# Patient Record
Sex: Female | Born: 1935 | Race: White | Hispanic: No | Smoking: Former smoker
Health system: Southern US, Community
[De-identification: ages and names within clinical notes are randomized; demographics above are authoritative.]

## PROBLEM LIST (undated history)

## (undated) DIAGNOSIS — I10 Essential (primary) hypertension: Secondary | ICD-10-CM

## (undated) DIAGNOSIS — R600 Localized edema: Secondary | ICD-10-CM

## (undated) DIAGNOSIS — I4891 Unspecified atrial fibrillation: Secondary | ICD-10-CM

## (undated) HISTORY — PX: UTERINE FIBROID SURGERY: SHX826

## (undated) HISTORY — DX: Localized edema: R60.0

## (undated) HISTORY — DX: Essential (primary) hypertension: I10

## (undated) HISTORY — DX: Unspecified atrial fibrillation: I48.91

---

## 2014-11-23 ENCOUNTER — Ambulatory Visit (INDEPENDENT_AMBULATORY_CARE_PROVIDER_SITE_OTHER): Payer: Medicare Other | Admitting: Family Medicine

## 2014-11-23 ENCOUNTER — Encounter: Payer: Self-pay | Admitting: Family Medicine

## 2014-11-23 VITALS — BP 100/70 | HR 64 | Ht <= 58 in | Wt 142.0 lb

## 2014-11-23 DIAGNOSIS — I482 Chronic atrial fibrillation, unspecified: Secondary | ICD-10-CM

## 2014-11-23 DIAGNOSIS — R2681 Unsteadiness on feet: Secondary | ICD-10-CM | POA: Diagnosis not present

## 2014-11-23 DIAGNOSIS — H409 Unspecified glaucoma: Secondary | ICD-10-CM

## 2014-11-23 DIAGNOSIS — S8001XA Contusion of right knee, initial encounter: Secondary | ICD-10-CM

## 2014-11-23 DIAGNOSIS — R600 Localized edema: Secondary | ICD-10-CM | POA: Diagnosis not present

## 2014-11-23 DIAGNOSIS — E559 Vitamin D deficiency, unspecified: Secondary | ICD-10-CM

## 2014-11-23 DIAGNOSIS — R413 Other amnesia: Secondary | ICD-10-CM

## 2014-11-23 DIAGNOSIS — I952 Hypotension due to drugs: Secondary | ICD-10-CM | POA: Diagnosis not present

## 2014-11-23 DIAGNOSIS — H3530 Unspecified macular degeneration: Secondary | ICD-10-CM | POA: Diagnosis not present

## 2014-11-23 NOTE — Patient Instructions (Signed)
Decrease verapamil SR to 120 mg daily (1/2 current tablet). Decrease Lasix (furosemide) to 20 mg daily (1/2 current tablet). Stop Coumadin (warfarin) and begin aspirin 81 mg daily (baby aspirin).

## 2014-11-24 DIAGNOSIS — H3530 Unspecified macular degeneration: Secondary | ICD-10-CM | POA: Insufficient documentation

## 2014-11-24 DIAGNOSIS — H409 Unspecified glaucoma: Secondary | ICD-10-CM | POA: Insufficient documentation

## 2014-11-24 DIAGNOSIS — R2681 Unsteadiness on feet: Secondary | ICD-10-CM | POA: Insufficient documentation

## 2014-11-24 DIAGNOSIS — R600 Localized edema: Secondary | ICD-10-CM | POA: Insufficient documentation

## 2014-11-24 DIAGNOSIS — I482 Chronic atrial fibrillation, unspecified: Secondary | ICD-10-CM | POA: Insufficient documentation

## 2014-11-24 DIAGNOSIS — E559 Vitamin D deficiency, unspecified: Secondary | ICD-10-CM | POA: Insufficient documentation

## 2014-11-24 DIAGNOSIS — R413 Other amnesia: Secondary | ICD-10-CM | POA: Insufficient documentation

## 2014-11-24 LAB — CBC
HEMOGLOBIN: 14.3 g/dL (ref 11.1–15.9)
Hematocrit: 42.4 % (ref 34.0–46.6)
MCH: 31.2 pg (ref 26.6–33.0)
MCHC: 33.7 g/dL (ref 31.5–35.7)
MCV: 92 fL (ref 79–97)
PLATELETS: 228 10*3/uL (ref 150–379)
RBC: 4.59 x10E6/uL (ref 3.77–5.28)
RDW: 14 % (ref 12.3–15.4)
WBC: 6.8 10*3/uL (ref 3.4–10.8)

## 2014-11-24 LAB — COMPREHENSIVE METABOLIC PANEL
ALK PHOS: 63 IU/L (ref 39–117)
ALT: 8 IU/L (ref 0–32)
AST: 15 IU/L (ref 0–40)
Albumin/Globulin Ratio: 1.4 (ref 1.1–2.5)
Albumin: 3.5 g/dL (ref 3.2–4.6)
BUN/Creatinine Ratio: 18 (ref 11–26)
BUN: 13 mg/dL (ref 10–36)
Bilirubin Total: 0.9 mg/dL (ref 0.0–1.2)
CALCIUM: 9.3 mg/dL (ref 8.7–10.3)
CO2: 23 mmol/L (ref 18–29)
Chloride: 102 mmol/L (ref 97–108)
Creatinine, Ser: 0.73 mg/dL (ref 0.57–1.00)
GFR, EST AFRICAN AMERICAN: 79 mL/min/{1.73_m2} (ref >59)
GFR, EST NON AFRICAN AMERICAN: 68 mL/min/{1.73_m2} (ref >59)
GLOBULIN, TOTAL: 2.5 g/dL (ref 1.5–4.5)
GLUCOSE: 88 mg/dL (ref 65–99)
POTASSIUM: 4.2 mmol/L (ref 3.5–5.2)
Sodium: 140 mmol/L (ref 134–144)
Total Protein: 6 g/dL (ref 6.0–8.5)

## 2014-11-24 LAB — TSH: TSH: 3.5 u[IU]/mL (ref 0.450–4.500)

## 2014-11-24 LAB — VITAMIN D 25 HYDROXY (VIT D DEFICIENCY, FRACTURES): Vit D, 25-Hydroxy: 29.9 ng/mL — ABNORMAL LOW (ref 30.0–100.0)

## 2014-11-24 MED ORDER — ASPIRIN 81 MG PO TABS: 81.0000 mg | ORAL_TABLET | Freq: Every day | ORAL | Status: AC

## 2014-11-24 NOTE — Addendum Note (Signed)
Addended by: Schuyler Amor on: 11/24/2014 05:11 PM   Modules accepted: Orders, Medications

## 2014-11-24 NOTE — Progress Notes (Signed)
Date:  11/23/2014   Name:  Maureen Arnold   DOB:  March 06, 1913   MRN:  [id number]  PCP:  Schuyler Amor, MD    Chief Complaint: Establish Care   History of Present Illness:  This is a 81 y.o. female with MMP including chronic afib, BLE edema, recent fall, and ST memory loss. Has been on verapamil and warfarin for 2 years, having increased bruising and dislikes blood draws. Fell two days ago injuring R knee, able to bear weight but hurts. Has BLE edema with stasis ulcer now healed, wears compression stockings, on Lasix prn, none in 5 days as traveling, usually helps, no known CHF. ST memory loss past 1.5 yrs, LT ok per daughter. Was getting home health PT, last saw MD in rehab 6 wks ago, hospitalized for pneumonia over winter with slow recovery. Weight down 8# past 2 months. Hospitalized for constipation in past. Needs optho and podiatry referrals glaucoma/MD and toenail care. Hearing poor. Incontinent urine, wears Depends.  Review of Systems:  Review of Systems  Constitutional: Negative for fever.  HENT: Negative for ear pain, sore throat and trouble swallowing.   Eyes: Negative for pain.  Respiratory: Negative for shortness of breath and wheezing.   Cardiovascular: Negative for chest pain.  Gastrointestinal: Negative for abdominal pain and constipation.  Endocrine: Negative for polyuria.  Genitourinary: Negative for dysuria.  Musculoskeletal: Negative for joint swelling.  Skin: Negative for rash.  Neurological: Negative for tremors and syncope.  Hematological: Negative for adenopathy.  Psychiatric/Behavioral: Negative for behavioral problems and sleep disturbance.    There are no active problems to display for this patient.   Prior to Admission medications   Medication Sig Start Date End Date Taking? Authorizing Provider  furosemide (LASIX) 40 MG tablet Take 40 mg by mouth.   Yes Historical Provider, MD  latanoprost (XALATAN) 0.005 % ophthalmic solution Place 1 drop into the left eye  at bedtime.   Yes Historical Provider, MD  verapamil (VERELAN PM) 240 MG 24 hr capsule Take 240 mg by mouth at bedtime.   Yes Historical Provider, MD  warfarin (COUMADIN) 2 MG tablet Take 2 mg by mouth daily.   Yes Historical Provider, MD    Allergies  Allergen Reactions  . Ibuprofen     bleeding    History reviewed. No pertinent past surgical history.  History  Substance Use Topics  . Smoking status: Former Smoker    Quit date: 11/20/1958  . Smokeless tobacco: Not on file  . Alcohol Use: No    History reviewed. No pertinent family history.  Medication list has been reviewed and updated.  Physical Examination: BP 100/70 mmHg  Pulse 64  Ht  (1.473 m)  Wt 142 lb (64.411 kg)  BMI 29.69 kg/m2  Physical Exam  Constitutional: She appears well-developed. No distress.  HENT:  Head: Normocephalic and atraumatic.  Nose: Nose normal.  Mouth/Throat: Oropharynx is clear and moist.  B TM's obscured by cerumen  Eyes: Conjunctivae and EOM are normal. Pupils are equal, round, and reactive to light.  Neck: Neck supple. No thyromegaly present.  Cardiovascular: Normal rate and normal heart sounds.   Irregular rhythm  Pulmonary/Chest: Effort normal and breath sounds normal.  Abdominal: Soft. She exhibits no distension and no mass. There is no tenderness.  Musculoskeletal:  3+ B LE edema to knees, comp stockings in place R knee stable, bears weight well, sl ecchymosis  Lymphadenopathy:    She has no cervical adenopathy.  Neurological: She is alert. Coordination normal.  Skin: Skin is warm and dry. No rash noted.  Psychiatric: She has a normal mood and affect. Her behavior is normal.  Nursing note and vitals reviewed.   Assessment and Plan:  1. Chronic atrial fibrillation D/c warfarin given increased bruising and falls, begin asa 81mg  daily - TSH - Comprehensive Metabolic Panel (CMET) - CBC  2. Gait instability Check vit D and B12, refer for home health PT  3.  Short-term memory loss Consider MMSE next visit  4. Hypotension due to drugs Decrease verapamil SR to 120 mg daily  5. Bilateral lower extremity edema Restart Lasix 20 mg daily  6. Glaucoma Refer optho  7. Macular degeneration Refer optho  8. Knee contusion, right, initial encounter Monitor  9. Toenail care Refer podiatry per daughter request  Return in about 4 weeks (around 12/21/2014).  Dionne Ano. Kingsley Spittle MD [facility]  11/24/2014

## 2014-11-27 LAB — B-12 BINDING CAPACITY: VITAMIN B12 BIND CAP: 626 pg/mL — AB (ref 725–2045)

## 2014-12-06 ENCOUNTER — Ambulatory Visit: Payer: Self-pay | Admitting: Family Medicine

## 2014-12-27 ENCOUNTER — Encounter: Payer: Self-pay | Admitting: Family Medicine

## 2014-12-27 ENCOUNTER — Other Ambulatory Visit: Payer: Self-pay | Admitting: Family Medicine

## 2014-12-27 ENCOUNTER — Ambulatory Visit (INDEPENDENT_AMBULATORY_CARE_PROVIDER_SITE_OTHER): Payer: Medicare Other | Admitting: Family Medicine

## 2014-12-27 VITALS — BP 122/76 | HR 84 | Ht <= 58 in | Wt 132.0 lb

## 2014-12-27 DIAGNOSIS — H409 Unspecified glaucoma: Secondary | ICD-10-CM

## 2014-12-27 DIAGNOSIS — R600 Localized edema: Secondary | ICD-10-CM

## 2014-12-27 DIAGNOSIS — N3001 Acute cystitis with hematuria: Secondary | ICD-10-CM

## 2014-12-27 DIAGNOSIS — R413 Other amnesia: Secondary | ICD-10-CM | POA: Diagnosis not present

## 2014-12-27 DIAGNOSIS — R410 Disorientation, unspecified: Secondary | ICD-10-CM | POA: Diagnosis not present

## 2014-12-27 DIAGNOSIS — I482 Chronic atrial fibrillation, unspecified: Secondary | ICD-10-CM

## 2014-12-27 DIAGNOSIS — R2681 Unsteadiness on feet: Secondary | ICD-10-CM

## 2014-12-27 LAB — POCT URINALYSIS DIPSTICK
Bilirubin, UA: NEGATIVE
Glucose, UA: NEGATIVE
Ketones, UA: NEGATIVE
Nitrite, UA: POSITIVE
Spec Grav, UA: 1.025
Urobilinogen, UA: 0.2
pH, UA: 6

## 2014-12-27 MED ORDER — VERAPAMIL HCL ER 240 MG PO TBCR: 120.0000 mg | EXTENDED_RELEASE_TABLET | Freq: Every day | ORAL | Status: DC

## 2014-12-27 MED ORDER — SULFAMETHOXAZOLE-TRIMETHOPRIM 800-160 MG PO TABS: 1.0000 | ORAL_TABLET | Freq: Two times a day (BID) | ORAL | Status: DC

## 2014-12-27 NOTE — Progress Notes (Signed)
Date:  12/27/2014   Name:  Maureen Arnold   DOB:  1912-07-01   MRN:  [id number]  PCP:  Schuyler Amor, MD    Chief Complaint: Follow-up and Altered Mental Status   History of Present Illness:  This is a 81 y.o. female with increased confusion past week. Home PT seeing for gait instability, BLE edema improved on Lasix initially but pt now refusing to take as makes urinate more, last dose 8 days ago. Saw optho for glaucoma/MD, no changes made. Also saw podiatry for toenail trimming. Appetite also decreased past week.  Review of Systems:  Review of Systems  Constitutional: Negative for fever.  Genitourinary: Negative for dysuria, hematuria, flank pain and difficulty urinating.    Patient Active Problem List   Diagnosis Date Noted  . Chronic atrial fibrillation 11/24/2014  . Gait instability 11/24/2014  . Short-term memory loss 11/24/2014  . Bilateral lower extremity edema 11/24/2014  . Glaucoma 11/24/2014  . Macular degeneration 11/24/2014  . Vitamin D deficiency 11/24/2014    Prior to Admission medications   Medication Sig Start Date End Date Taking? Authorizing Provider  aspirin 81 MG tablet Take 1 tablet (81 mg total) by mouth daily. 11/24/14  Yes Schuyler Amor, MD  latanoprost (XALATAN) 0.005 % ophthalmic solution Place 1 drop into the left eye at bedtime.   Yes Historical Provider, MD  furosemide (LASIX) 40 MG tablet Take 20 mg by mouth every other day.    Historical Provider, MD  sulfamethoxazole-trimethoprim (BACTRIM DS,SEPTRA DS) 800-160 MG per tablet Take 1 tablet by mouth 2 (two) times daily. 12/27/14   Schuyler Amor, MD  verapamil (CALAN-SR) 240 MG CR tablet Take 0.5 tablets (120 mg total) by mouth daily. 12/27/14   Schuyler Amor, MD    Allergies  Allergen Reactions  . Ibuprofen     bleeding    History reviewed. No pertinent past surgical history.  Social History  Substance Use Topics  . Smoking status: Former Smoker    Quit date: 11/20/1958  . Smokeless tobacco:  None  . Alcohol Use: No    History reviewed. No pertinent family history.  Medication list has been reviewed and updated.  Physical Examination: BP 122/76 mmHg  Pulse 84  Ht  (1.473 m)  Wt 132 lb (59.875 kg)  BMI 27.60 kg/m2  Physical Exam  Constitutional: She appears well-developed and well-nourished.  Cardiovascular: Normal rate and normal heart sounds.   Irregularly irregular rhythm  Pulmonary/Chest: Effort normal and breath sounds normal.  Musculoskeletal:  1+ BLE edema  Neurological: She is alert.  Disoriented to place and time Able to count backwards from 20 2/5 recall  Skin: Skin is warm and dry. No rash noted.  Psychiatric: She has a normal mood and affect.    Assessment and Plan:  1. Acute cystitis with hematuria UA shows pos nitrite, 2+ leuk, tr blood - Urine Culture - sulfamethoxazole-trimethoprim (BACTRIM DS,SEPTRA DS) 800-160 MG per tablet; Take 1 tablet by mouth 2 (two) times daily.  Dispense: 6 tablet; Refill: 0  2. Confusion Likely due to UTI - B12 - POCT urinalysis dipstick  3. Chronic atrial fibrillation Well controlled on decreased verapamil dose, off warfarin, cont asa  4. Gait instability Improved with home PT, consider vitamin D supplementation next visit given marginal level  5. Short-term memory loss - B12  6. Bilateral lower extremity edema Improved, try Lasix 20 mg qod to maintain  7. Glaucoma Saw optho, continue eye drops   Return in about 4 weeks (around  01/24/2015).  Dionne Ano. Kingsley Spittle MD [facility]  12/27/2014

## 2014-12-28 LAB — VITAMIN B12: Vitamin B-12: 396 pg/mL (ref 211–946)

## 2014-12-29 LAB — URINE CULTURE

## 2015-01-26 ENCOUNTER — Ambulatory Visit: Payer: Medicare Other | Admitting: Family Medicine

## 2015-01-31 ENCOUNTER — Encounter: Payer: Self-pay | Admitting: Family Medicine

## 2015-01-31 ENCOUNTER — Ambulatory Visit (INDEPENDENT_AMBULATORY_CARE_PROVIDER_SITE_OTHER): Payer: Medicare Other | Admitting: Family Medicine

## 2015-01-31 VITALS — BP 110/72 | HR 62 | Ht <= 58 in | Wt 124.0 lb

## 2015-01-31 DIAGNOSIS — Z23 Encounter for immunization: Secondary | ICD-10-CM

## 2015-01-31 DIAGNOSIS — I482 Chronic atrial fibrillation, unspecified: Secondary | ICD-10-CM

## 2015-01-31 DIAGNOSIS — R2681 Unsteadiness on feet: Secondary | ICD-10-CM

## 2015-01-31 DIAGNOSIS — E559 Vitamin D deficiency, unspecified: Secondary | ICD-10-CM

## 2015-01-31 DIAGNOSIS — R413 Other amnesia: Secondary | ICD-10-CM | POA: Diagnosis not present

## 2015-01-31 DIAGNOSIS — R600 Localized edema: Secondary | ICD-10-CM

## 2015-01-31 MED ORDER — VITAMIN D3 25 MCG (1000 UT) PO CAPS: 1.0000 | ORAL_CAPSULE | Freq: Every day | ORAL | Status: AC

## 2015-02-01 ENCOUNTER — Telehealth: Payer: Self-pay

## 2015-02-01 NOTE — Telephone Encounter (Signed)
Sent to [place] 

## 2015-02-01 NOTE — Telephone Encounter (Signed)
Done

## 2015-02-01 NOTE — Progress Notes (Signed)
Date:  01/31/2015   Name:  Maureen Arnold   DOB:  September 23, 1912   MRN:  [id number]  PCP:  Schuyler AmorWilliam Taffany Heiser, MD    Chief Complaint: vomitting and Follow-up   History of Present Illness:  This is a 81 y.o. female for f/u MMP. UTI sxs from last visit resolved. Stopped Lasix last week and BLE edema has stayed stable with compression stockings. Sundowning. Vomited once yesterday but none today. Bathing is issue, daughter says can't do alone, want HH referral. Still very unsteady on feet but no falls, using rolling walker. Vit D borderline low in August. Needs flu imm.  Review of Systems:  Review of Systems  Constitutional: Negative for fever and unexpected weight change.  HENT: Negative for trouble swallowing.   Respiratory: Negative for shortness of breath.   Cardiovascular: Negative for chest pain.  Gastrointestinal: Negative for abdominal pain.  Genitourinary: Negative for difficulty urinating.  Skin: Negative for rash.  Neurological: Negative for dizziness, tremors, syncope and light-headedness.  Psychiatric/Behavioral: Negative for hallucinations.    Patient Active Problem List   Diagnosis Date Noted  . Chronic atrial fibrillation (HCC) 11/24/2014  . Gait instability 11/24/2014  . Short-term memory loss 11/24/2014  . Bilateral lower extremity edema 11/24/2014  . Glaucoma 11/24/2014  . Macular degeneration 11/24/2014  . Vitamin D deficiency 11/24/2014    Prior to Admission medications   Medication Sig Start Date End Date Taking? Authorizing Provider  aspirin 81 MG tablet Take 1 tablet (81 mg total) by mouth daily. 11/24/14  Yes Schuyler AmorWilliam Desiree Fleming, MD  latanoprost (XALATAN) 0.005 % ophthalmic solution Place 1 drop into the left eye at bedtime.   Yes Historical Provider, MD  verapamil (CALAN-SR) 240 MG CR tablet Take 0.5 tablets (120 mg total) by mouth daily. 12/27/14  Yes Schuyler AmorWilliam Destiny Hagin, MD  Cholecalciferol (VITAMIN D3) 1000 UNITS CAPS Take 1 capsule (1,000 Units total) by mouth daily.  01/31/15   Schuyler AmorWilliam Gianfranco Araki, MD  furosemide (LASIX) 40 MG tablet Take 20 mg by mouth every other day.    Historical Provider, MD    Allergies  Allergen Reactions  . Ibuprofen     bleeding    No past surgical history on file.  Social History  Substance Use Topics  . Smoking status: Former Smoker    Quit date: 11/20/1958  . Smokeless tobacco: None  . Alcohol Use: No    No family history on file.  Medication list has been reviewed and updated.  Physical Examination: BP 110/72 mmHg  Pulse 62  Ht 4\' 10"  (1.473 m)  Wt 124 lb (56.246 kg)  BMI 25.92 kg/m2  Physical Exam  Constitutional: She appears well-developed and well-nourished. No distress.  Cardiovascular: Normal rate and normal heart sounds.   Irregular rhythm  Pulmonary/Chest: Effort normal and breath sounds normal.  Musculoskeletal:  1+ BLE edema  Neurological: She is alert. Coordination normal.  Skin: Skin is warm and dry.  Psychiatric: She has a normal mood and affect. Her behavior is normal.  Nursing note and vitals reviewed.   Assessment and Plan:  1. Chronic atrial fibrillation (HCC) Continue asa (off warfarin due to fall risk)  2. Gait instability Begin vit D supplement, HH referral  3. Short-term memory loss Chronic with sundowning  4. Bilateral lower extremity edema Stable off Lasix for now, continue compression stockings  5. Vitamin D deficiency Begin vit D supplement  6. Need for influenza vaccination - Flu Vaccine QUAD 36+ mos PF IM (Fluarix & Fluzone Quad PF)   No Follow-up  on file.  Dionne Ano. Kingsley Spittle MD [facility]  02/01/2015

## 2015-02-19 ENCOUNTER — Other Ambulatory Visit: Payer: Self-pay

## 2015-03-08 ENCOUNTER — Emergency Department: Payer: Medicare Other

## 2015-03-08 ENCOUNTER — Inpatient Hospital Stay
Admission: EM | Admit: 2015-03-08 | Discharge: 2015-03-12 | DRG: 309 | Disposition: A | Discharge status: Skilled Nursing Facility | Payer: Medicare Other | Attending: Internal Medicine | Admitting: Internal Medicine

## 2015-03-08 ENCOUNTER — Encounter: Payer: Self-pay | Admitting: Emergency Medicine

## 2015-03-08 DIAGNOSIS — B9620 Unspecified Escherichia coli [E. coli] as the cause of diseases classified elsewhere: Secondary | ICD-10-CM | POA: Diagnosis present

## 2015-03-08 DIAGNOSIS — I482 Chronic atrial fibrillation: Principal | ICD-10-CM | POA: Diagnosis present

## 2015-03-08 DIAGNOSIS — Z888 Allergy status to other drugs, medicaments and biological substances status: Secondary | ICD-10-CM | POA: Diagnosis not present

## 2015-03-08 DIAGNOSIS — R109 Unspecified abdominal pain: Secondary | ICD-10-CM

## 2015-03-08 DIAGNOSIS — G8929 Other chronic pain: Secondary | ICD-10-CM | POA: Diagnosis present

## 2015-03-08 DIAGNOSIS — N390 Urinary tract infection, site not specified: Secondary | ICD-10-CM | POA: Diagnosis present

## 2015-03-08 DIAGNOSIS — N3090 Cystitis, unspecified without hematuria: Secondary | ICD-10-CM

## 2015-03-08 DIAGNOSIS — Z79899 Other long term (current) drug therapy: Secondary | ICD-10-CM

## 2015-03-08 DIAGNOSIS — I10 Essential (primary) hypertension: Secondary | ICD-10-CM | POA: Diagnosis present

## 2015-03-08 DIAGNOSIS — I4891 Unspecified atrial fibrillation: Secondary | ICD-10-CM | POA: Diagnosis present

## 2015-03-08 DIAGNOSIS — E876 Hypokalemia: Secondary | ICD-10-CM | POA: Diagnosis present

## 2015-03-08 DIAGNOSIS — D693 Immune thrombocytopenic purpura: Secondary | ICD-10-CM | POA: Diagnosis present

## 2015-03-08 DIAGNOSIS — Z7982 Long term (current) use of aspirin: Secondary | ICD-10-CM | POA: Diagnosis not present

## 2015-03-08 DIAGNOSIS — Z87891 Personal history of nicotine dependence: Secondary | ICD-10-CM | POA: Diagnosis not present

## 2015-03-08 DIAGNOSIS — R1031 Right lower quadrant pain: Secondary | ICD-10-CM

## 2015-03-08 DIAGNOSIS — Z833 Family history of diabetes mellitus: Secondary | ICD-10-CM | POA: Diagnosis not present

## 2015-03-08 DIAGNOSIS — K567 Ileus, unspecified: Secondary | ICD-10-CM | POA: Diagnosis present

## 2015-03-08 LAB — CBC WITH DIFFERENTIAL/PLATELET
BASOS ABS: 0 10*3/uL (ref 0–0.1)
Basophils Relative: 1 %
EOS PCT: 1 %
Eosinophils Absolute: 0.1 10*3/uL (ref 0–0.7)
HCT: 45.3 % (ref 35.0–47.0)
Hemoglobin: 15.2 g/dL (ref 12.0–16.0)
LYMPHS ABS: 1.2 10*3/uL (ref 1.0–3.6)
LYMPHS PCT: 16 %
MCH: 31.4 pg (ref 26.0–34.0)
MCHC: 33.5 g/dL (ref 32.0–36.0)
MCV: 93.8 fL (ref 80.0–100.0)
MONO ABS: 0.7 10*3/uL (ref 0.2–0.9)
Monocytes Relative: 9 %
Neutro Abs: 5.5 10*3/uL (ref 1.4–6.5)
Neutrophils Relative %: 73 %
PLATELETS: 148 10*3/uL — AB (ref 150–440)
RBC: 4.84 MIL/uL (ref 3.80–5.20)
RDW: 13.2 % (ref 11.5–14.5)
WBC: 7.5 10*3/uL (ref 3.6–11.0)

## 2015-03-08 LAB — URINALYSIS COMPLETE WITH MICROSCOPIC (ARMC ONLY)
Bilirubin Urine: NEGATIVE
GLUCOSE, UA: NEGATIVE mg/dL
Hgb urine dipstick: NEGATIVE
NITRITE: POSITIVE — AB
Protein, ur: 30 mg/dL — AB
SPECIFIC GRAVITY, URINE: 1.018 (ref 1.005–1.030)
pH: 5 (ref 5.0–8.0)

## 2015-03-08 LAB — COMPREHENSIVE METABOLIC PANEL
ALT: 11 U/L — ABNORMAL LOW (ref 14–54)
ANION GAP: 8 (ref 5–15)
AST: 16 U/L (ref 15–41)
Albumin: 3.1 g/dL — ABNORMAL LOW (ref 3.5–5.0)
Alkaline Phosphatase: 54 U/L (ref 38–126)
BUN: 16 mg/dL (ref 6–20)
CHLORIDE: 106 mmol/L (ref 101–111)
CO2: 25 mmol/L (ref 22–32)
Calcium: 9.5 mg/dL (ref 8.9–10.3)
Creatinine, Ser: 0.55 mg/dL (ref 0.44–1.00)
Glucose, Bld: 91 mg/dL (ref 65–99)
POTASSIUM: 3.4 mmol/L — AB (ref 3.5–5.1)
Sodium: 139 mmol/L (ref 135–145)
Total Bilirubin: 1.7 mg/dL — ABNORMAL HIGH (ref 0.3–1.2)
Total Protein: 6.1 g/dL — ABNORMAL LOW (ref 6.5–8.1)

## 2015-03-08 LAB — LIPASE, BLOOD: LIPASE: 25 U/L (ref 11–51)

## 2015-03-08 LAB — TROPONIN I

## 2015-03-08 MED ORDER — ONDANSETRON HCL 4 MG/2ML IJ SOLN: 4.0000 mg | Freq: Four times a day (QID) | INTRAMUSCULAR | Status: DC | PRN

## 2015-03-08 MED ORDER — ACETAMINOPHEN 650 MG RE SUPP: 650.0000 mg | Freq: Four times a day (QID) | RECTAL | Status: DC | PRN

## 2015-03-08 MED ORDER — LORAZEPAM 2 MG/ML IJ SOLN
0.5000 mg | Freq: Once | INTRAMUSCULAR | Status: AC
  Administered 2015-03-08: 0.5 mg via INTRAVENOUS
  Filled 2015-03-08: qty 1

## 2015-03-08 MED ORDER — DILTIAZEM HCL 30 MG PO TABS
30.0000 mg | ORAL_TABLET | Freq: Once | ORAL | Status: AC
  Administered 2015-03-08: 30 mg via ORAL
  Filled 2015-03-08: qty 1

## 2015-03-08 MED ORDER — LORAZEPAM 2 MG/ML IJ SOLN: 0.5000 mg | Freq: Once | INTRAMUSCULAR | Status: DC

## 2015-03-08 MED ORDER — LIDOCAINE-EPINEPHRINE (PF) 1 %-1:200000 IJ SOLN
INTRAMUSCULAR | Status: AC
  Filled 2015-03-08: qty 30

## 2015-03-08 MED ORDER — MORPHINE SULFATE (PF) 2 MG/ML IV SOLN
2.0000 mg | Freq: Once | INTRAVENOUS | Status: DC
  Filled 2015-03-08: qty 1

## 2015-03-08 MED ORDER — IOHEXOL 240 MG/ML SOLN
25.0000 mL | Freq: Once | INTRAMUSCULAR | Status: AC | PRN
  Administered 2015-03-08: 25 mL via ORAL

## 2015-03-08 MED ORDER — SODIUM CHLORIDE 0.9 % IV SOLN
INTRAVENOUS | Status: DC
  Administered 2015-03-09: 02:00:00 via INTRAVENOUS

## 2015-03-08 MED ORDER — ONDANSETRON HCL 4 MG PO TABS
4.0000 mg | ORAL_TABLET | Freq: Four times a day (QID) | ORAL | Status: DC | PRN
  Administered 2015-03-11: 4 mg via ORAL
  Filled 2015-03-08: qty 1

## 2015-03-08 MED ORDER — SODIUM CHLORIDE 0.9 % IJ SOLN
3.0000 mL | Freq: Two times a day (BID) | INTRAMUSCULAR | Status: DC
  Administered 2015-03-09 – 2015-03-12 (×7): 3 mL via INTRAVENOUS

## 2015-03-08 MED ORDER — SODIUM CHLORIDE 0.9 % IV SOLN
1000.0000 mL | Freq: Once | INTRAVENOUS | Status: AC
  Administered 2015-03-08: 1000 mL via INTRAVENOUS

## 2015-03-08 MED ORDER — MORPHINE SULFATE (PF) 2 MG/ML IV SOLN: 1.0000 mg | INTRAVENOUS | Status: DC | PRN

## 2015-03-08 MED ORDER — ONDANSETRON HCL 4 MG/2ML IJ SOLN
4.0000 mg | Freq: Once | INTRAMUSCULAR | Status: AC
  Administered 2015-03-08: 4 mg via INTRAVENOUS
  Filled 2015-03-08: qty 2

## 2015-03-08 MED ORDER — DEXTROSE 5 % IV SOLN
1.0000 g | Freq: Once | INTRAVENOUS | Status: AC
  Administered 2015-03-08: 1 g via INTRAVENOUS
  Filled 2015-03-08: qty 10

## 2015-03-08 MED ORDER — OXYCODONE HCL 5 MG PO TABS
5.0000 mg | ORAL_TABLET | ORAL | Status: DC | PRN
  Administered 2015-03-11: 5 mg via ORAL
  Filled 2015-03-08: qty 1

## 2015-03-08 MED ORDER — DILTIAZEM HCL 25 MG/5ML IV SOLN
10.0000 mg | Freq: Once | INTRAVENOUS | Status: AC
  Administered 2015-03-08: 10 mg via INTRAVENOUS
  Filled 2015-03-08: qty 5

## 2015-03-08 MED ORDER — HEPARIN SODIUM (PORCINE) 5000 UNIT/ML IJ SOLN
5000.0000 [IU] | Freq: Three times a day (TID) | INTRAMUSCULAR | Status: DC
  Administered 2015-03-09 – 2015-03-12 (×10): 5000 [IU] via SUBCUTANEOUS
  Filled 2015-03-08 (×10): qty 1

## 2015-03-08 MED ORDER — IOHEXOL 300 MG/ML  SOLN
100.0000 mL | Freq: Once | INTRAMUSCULAR | Status: AC | PRN
  Administered 2015-03-08: 100 mL via INTRAVENOUS

## 2015-03-08 MED ORDER — ACETAMINOPHEN 325 MG PO TABS: 650.0000 mg | ORAL_TABLET | Freq: Four times a day (QID) | ORAL | Status: DC | PRN

## 2015-03-08 NOTE — ED Notes (Signed)
Patient transported to X-ray 

## 2015-03-08 NOTE — ED Provider Notes (Addendum)
[facility] Emergency Department Provider Note     Time seen: ----------------------------------------- 6:59 PM on 03/08/2015 -----------------------------------------    I have reviewed the triage vital signs and the nursing notes.   HISTORY  Chief Complaint Abdominal Pain and Emesis    HPI Maureen Arnold is a 81 y.o. female who presents ER for abdominal pain and vomiting over the past several days. Patient points right lower quadrant abdominal pain, nothing makes the pain better or worse. She doesn't have a history of same, denies fevers chills or other complaints other than the vomiting associated with pain. Caregiver is unsure when her last bowel movement was.   Past Medical History  Diagnosis Date  . Hypertension   . Atrial fibrillation (HCC)   . Edema extremities     Patient Active Problem List   Diagnosis Date Noted  . Chronic atrial fibrillation (HCC) 11/24/2014  . Gait instability 11/24/2014  . Short-term memory loss 11/24/2014  . Bilateral lower extremity edema 11/24/2014  . Glaucoma 11/24/2014  . Macular degeneration 11/24/2014  . Vitamin D deficiency 11/24/2014    No past surgical history on file.  Allergies Ibuprofen  Social History Social History  Substance Use Topics  . Smoking status: Former Smoker    Quit date: 11/20/1958  . Smokeless tobacco: Not on file  . Alcohol Use: No    Review of Systems Constitutional: Negative for fever. Eyes: Negative for visual changes. ENT: Negative for sore throat. Cardiovascular: Negative for chest pain. Respiratory: Negative for shortness of breath. Gastrointestinal: Positive for abdominal pain and vomiting Genitourinary: Negative for dysuria. Musculoskeletal: Negative for back pain. Skin: Negative for rash. Neurological: Negative for headaches, focal weakness or numbness.  10-point ROS otherwise negative.  ____________________________________________   PHYSICAL  EXAM:  VITAL SIGNS: ED Triage Vitals  Enc Vitals Group     BP --      Pulse --      Resp --      Temp --      Temp src --      SpO2 --      Weight --      Height --      Head Cir --      Peak Flow --      Pain Score --      Pain Loc --      Pain Edu? --      Excl. in GC? --     Constitutional: Alert and oriented. Well appearing and in no distress. Eyes: Conjunctivae are normal. PERRL. Normal extraocular movements. ENT   Head: Normocephalic and atraumatic.   Nose: No congestion/rhinnorhea.   Mouth/Throat: Mucous membranes are moist.   Neck: No stridor. Cardiovascular: Normal rate, regular rhythm. Normal and symmetric distal pulses are present in all extremities.  Respiratory: Normal respiratory effort without tachypnea nor retractions. Breath sounds are clear and equal bilaterally. No wheezes/rales/rhonchi. Gastrointestinal: Right lower quadrant abdominal tenderness, no rebound or guarding. Normal bowel sounds. Musculoskeletal: Nontender with normal range of motion in all extremities. No joint effusions.  No lower extremity tenderness nor edema. Neurologic:  Normal speech and language. No gross focal neurologic deficits are appreciated. Speech is normal Skin:  Skin is warm, dry and intact. No rash noted. Psychiatric: Mood and affect are normal. Speech and behavior are normal.  ____________________________________________  EKG: Interpreted by me. Atrial fibrillation with a rapid ventricular response with a rate of 121 bpm, normal axis, borderline long QT interval, repolarization abnormality.  ____________________________________________  ED COURSE:  Pertinent labs & imaging results that were available during my care of the patient were reviewed by me and considered in my medical decision making (see chart for details).  ____________________________________________    LABS (pertinent positives/negatives)  Labs Reviewed  CBC WITH DIFFERENTIAL/PLATELET -  Abnormal; Notable for the following:    Platelets 148 (*)    All other components within normal limits  COMPREHENSIVE METABOLIC PANEL - Abnormal; Notable for the following:    Potassium 3.4 (*)    Total Protein 6.1 (*)    Albumin 3.1 (*)    ALT 11 (*)    Total Bilirubin 1.7 (*)    All other components within normal limits  URINALYSIS COMPLETEWITH MICROSCOPIC ([facility] ONLY) - Abnormal; Notable for the following:    Color, Urine AMBER (*)    APPearance CLOUDY (*)    Ketones, ur 1+ (*)    Protein, ur 30 (*)    Nitrite POSITIVE (*)    Leukocytes, UA 3+ (*)    Bacteria, UA MANY (*)    Squamous Epithelial / LPF 0-5 (*)    All other components within normal limits  URINE CULTURE  LIPASE, BLOOD  TROPONIN I    RADIOLOGY Images were viewed by me  Abdomen 2 view IMPRESSION: Mild gaseous distension of small bowel and colon which may represent an ileus. No evidence of bowel obstruction or pneumoperitoneum.  IMPRESSION: 1. Large right Spigelian hernia containing loops of small bowel and the cecum, without obstruction or strangulation. 2. Cholelithiasis 3. 15 mm enhancing left adrenal nodule suggesting metastasis or pheochromocytoma. 4. Colonic diverticulosis most most marked in the sigmoid colon 5. T12 compression fracture deformity post intervention. Severe T9 compression fracture, age indeterminate. 6. Bibasilar airspace opacities with small left effusion. 7. Possible splenic infarcts, age indeterminate. ____________________________________________  FINAL ASSESSMENT AND PLAN  Abdominal pain, vomiting, cystitis, ileus, atrial fibrillation with rapid ventricular response  Plan: Patient with labs and imaging as dictated above. CT scan is pending at the time of my checkout. She's been given IV fluids, Cardizem, Ativan, Rocephin. Family is requesting admission, she remains in rapid atrial fibrillation and would benefit from observation.   Emily Filbert, MD   Emily Filbert, MD 03/08/15 [id number]  Emily Filbert, MD 03/08/15 [phone]

## 2015-03-08 NOTE — ED Notes (Signed)
Pt to ED via EMS from home c/o abd pain and vomitng that started 3 days ago.  Per EMS pt c/o RLQ abd pain with tenderness to touch.  Pt is A&Ox4, speaking in complete and coherent sentences and in NAD at this time.

## 2015-03-09 DIAGNOSIS — E876 Hypokalemia: Secondary | ICD-10-CM

## 2015-03-09 DIAGNOSIS — G8929 Other chronic pain: Secondary | ICD-10-CM

## 2015-03-09 DIAGNOSIS — R1031 Right lower quadrant pain: Secondary | ICD-10-CM

## 2015-03-09 LAB — HEPATIC FUNCTION PANEL
ALT: 12 U/L — ABNORMAL LOW (ref 14–54)
AST: 21 U/L (ref 15–41)
Albumin: 3.1 g/dL — ABNORMAL LOW (ref 3.5–5.0)
Alkaline Phosphatase: 52 U/L (ref 38–126)
BILIRUBIN DIRECT: 0.4 mg/dL (ref 0.1–0.5)
BILIRUBIN INDIRECT: 1.1 mg/dL — AB (ref 0.3–0.9)
Total Bilirubin: 1.5 mg/dL — ABNORMAL HIGH (ref 0.3–1.2)
Total Protein: 5.9 g/dL — ABNORMAL LOW (ref 6.5–8.1)

## 2015-03-09 LAB — MAGNESIUM: Magnesium: 1.7 mg/dL (ref 1.7–2.4)

## 2015-03-09 MED ORDER — DEXTROSE 5 % IV SOLN
1.0000 g | INTRAVENOUS | Status: DC
  Administered 2015-03-09 – 2015-03-12 (×4): 1 g via INTRAVENOUS
  Filled 2015-03-09 (×5): qty 10

## 2015-03-09 MED ORDER — VITAMIN D 1000 UNITS PO TABS
1000.0000 [IU] | ORAL_TABLET | Freq: Every day | ORAL | Status: DC
  Administered 2015-03-09 – 2015-03-11 (×3): 1000 [IU] via ORAL
  Filled 2015-03-09 (×3): qty 1

## 2015-03-09 MED ORDER — ASPIRIN 81 MG PO CHEW
81.0000 mg | CHEWABLE_TABLET | Freq: Every day | ORAL | Status: DC
Start: 2015-03-09 — End: 2015-03-12
  Administered 2015-03-09 – 2015-03-11 (×3): 81 mg via ORAL
  Filled 2015-03-09 (×3): qty 1

## 2015-03-09 MED ORDER — BISACODYL 10 MG RE SUPP
10.0000 mg | Freq: Once | RECTAL | Status: AC
  Administered 2015-03-09: 10 mg via RECTAL
  Filled 2015-03-09: qty 1

## 2015-03-09 MED ORDER — CEPHALEXIN 250 MG PO CAPS: 250.0000 mg | ORAL_CAPSULE | Freq: Three times a day (TID) | ORAL | Status: DC

## 2015-03-09 MED ORDER — VERAPAMIL HCL ER 120 MG PO TBCR
120.0000 mg | EXTENDED_RELEASE_TABLET | Freq: Every day | ORAL | Status: DC
  Filled 2015-03-09 (×2): qty 1

## 2015-03-09 MED ORDER — DOCUSATE SODIUM 100 MG PO CAPS: 100.0000 mg | ORAL_CAPSULE | Freq: Two times a day (BID) | ORAL | Status: DC

## 2015-03-09 MED ORDER — VERAPAMIL HCL ER 120 MG PO TBCR
120.0000 mg | EXTENDED_RELEASE_TABLET | Freq: Two times a day (BID) | ORAL | Status: DC
  Administered 2015-03-09 – 2015-03-12 (×7): 120 mg via ORAL
  Filled 2015-03-09 (×7): qty 1

## 2015-03-09 MED ORDER — LATANOPROST 0.005 % OP SOLN
1.0000 [drp] | Freq: Every day | OPHTHALMIC | Status: DC
  Administered 2015-03-09 – 2015-03-11 (×3): 1 [drp] via OPHTHALMIC
  Filled 2015-03-09: qty 2.5

## 2015-03-09 MED ORDER — VERAPAMIL HCL ER 240 MG PO TBCR: 120.0000 mg | EXTENDED_RELEASE_TABLET | Freq: Two times a day (BID) | ORAL | Status: DC

## 2015-03-09 NOTE — Progress Notes (Signed)
Daughter, Liborio NixonJanice, called and updated on plan of care and that patient will stay the night tonight.

## 2015-03-09 NOTE — H&P (Signed)
[facility] - [place] [facility]   PATIENT NAME: Maureen Arnold    MR#:  [id number]  DATE OF BIRTH:  April 10, 1913   DATE OF ADMISSION:  03/08/2015  PRIMARY CARE PHYSICIAN: Schuyler Amor, MD   REQUESTING/REFERRING PHYSICIAN: Mayford Knife  CHIEF COMPLAINT:   Chief Complaint  Patient presents with  . Abdominal Pain  . Emesis    HISTORY OF PRESENT ILLNESS:  Maureen Arnold  is a 81 y.o. female with a known history of essential hypertension, chronic atrial fibrillation who is presenting with abdominal pain. History aided by daughter present at bedside. They described three-day total duration of abdominal pain intermittent described only as "pain" no worsening or relieving factors intensity4/10. Associated decreased by mouth intake without frank emesis still passing flat is still having bowel movements. On arrival to the emergency department noted to be in atrial fibrillation rapid ventricular response heart rate 130s   PAST MEDICAL HISTORY:   Past Medical History  Diagnosis Date  . Hypertension   . Atrial fibrillation (HCC)   . Edema extremities     PAST SURGICAL HISTORY:   Past Surgical History  Procedure Laterality Date  . Uterine fibroid surgery      SOCIAL HISTORY:   Social History  Substance Use Topics  . Smoking status: Former Smoker    Quit date: 11/20/1958  . Smokeless tobacco: Not on file  . Alcohol Use: 0.6 oz/week    1 Glasses of wine, 0 Standard drinks or equivalent per week     Comment: "every now and then"    FAMILY HISTORY:   Family History  Problem Relation Age of Onset  . Diabetes Neg Hx     DRUG ALLERGIES:   Allergies  Allergen Reactions  . Ibuprofen Other (See Comments)    bleeding    REVIEW OF SYSTEMS:  REVIEW OF SYSTEMS:  CONSTITUTIONAL: Denies fevers, chills, positive fatigue, weakness.  EYES: Denies blurred vision, double vision, or eye pain.  EARS, NOSE, THROAT: Denies tinnitus, ear pain, hearing  loss.  RESPIRATORY: denies cough, shortness of breath, wheezing  CARDIOVASCULAR: Denies chest pain, palpitations, edema.  GASTROINTESTINAL: Denies nausea, vomiting, diarrhea, positive abdominal pain.  GENITOURINARY: Denies dysuria, hematuria.  ENDOCRINE: Denies nocturia or thyroid problems. HEMATOLOGIC AND LYMPHATIC: Denies easy bruising or bleeding.  SKIN: Denies rash or lesions.  MUSCULOSKELETAL: Denies pain in neck, back, shoulder, knees, hips, or further arthritic symptoms.  NEUROLOGIC: Denies paralysis, paresthesias.  PSYCHIATRIC: Denies anxiety or depressive symptoms. Otherwise full review of systems performed by me is negative.   MEDICATIONS AT HOME:   Prior to Admission medications   Medication Sig Start Date End Date Taking? Authorizing Provider  aspirin 81 MG tablet Take 1 tablet (81 mg total) by mouth daily. Patient taking differently: Take 81 mg by mouth at bedtime.  11/24/14  Yes Schuyler Amor, MD  Cholecalciferol (VITAMIN D3) 1000 UNITS CAPS Take 1 capsule (1,000 Units total) by mouth daily. Patient taking differently: Take 1 capsule by mouth at bedtime.  01/31/15  Yes Schuyler Amor, MD  latanoprost (XALATAN) 0.005 % ophthalmic solution Place 1 drop into the left eye at bedtime.   Yes Historical Provider, MD  verapamil (CALAN-SR) 240 MG CR tablet Take 0.5 tablets (120 mg total) by mouth daily. Patient taking differently: Take 120 mg by mouth at bedtime.  12/27/14  Yes Schuyler Amor, MD      VITAL SIGNS:  Blood pressure 112/57, pulse 138, temperature 97.4 F (36.3 C), temperature source Oral, resp. rate 16,  height  (1.499 m), weight 127 lb 10.3 oz (57.9 kg), SpO2 96 %.  PHYSICAL EXAMINATION:  VITAL SIGNS: Filed Vitals:   03/08/15 2330  BP: 112/57  Pulse: 138  Temp:   Resp: 16   GENERAL:81 y.o.female currently in no acute distress.  HEAD: Normocephalic, atraumatic.  EYES: Pupils equal, round, reactive to light. Extraocular muscles intact. No scleral icterus.   MOUTH: Dry mucosal membrane. Dentition intact. No abscess noted.  EAR, NOSE, THROAT: Clear without exudates. No external lesions.  NECK: Supple. No thyromegaly. No nodules. No JVD.  PULMONARY: Clear to ascultation, without wheeze rails or rhonci. No use of accessory muscles, Good respiratory effort. good air entry bilaterally CHEST: Nontender to palpation.  CARDIOVASCULAR: S1 and S2. Irregular rate and rhythm. No murmurs, rubs, or gallops. No edema. Pedal pulses 2+ bilaterally.  GASTROINTESTINAL: Soft, nontender, nondistended. No masses. Positive bowel sounds. No hepatosplenomegaly.  MUSCULOSKELETAL: No swelling, clubbing, or edema. Range of motion full in all extremities.  NEUROLOGIC: Cranial nerves II through XII are intact. No gross focal neurological deficits. Sensation intact. Reflexes intact.  SKIN: No ulceration, lesions, rashes, or cyanosis. Skin warm and dry. Turgor intact.  PSYCHIATRIC: Mood, affect within normal limits. The patient is awake, alert and oriented x 3. Insight, judgment intact.    LABORATORY PANEL:   CBC  Recent Labs Lab 03/08/15 2006  WBC 7.5  HGB 15.2  HCT 45.3  PLT 148*   ------------------------------------------------------------------------------------------------------------------  Chemistries   Recent Labs Lab 03/08/15 2006  NA 139  K 3.4*  CL 106  CO2 25  GLUCOSE 91  BUN 16  CREATININE 0.55  CALCIUM 9.5  AST 16  ALT 11*  ALKPHOS 54  BILITOT 1.7*   ------------------------------------------------------------------------------------------------------------------  Cardiac Enzymes  Recent Labs Lab 03/08/15 2006  TROPONINI <0.03   ------------------------------------------------------------------------------------------------------------------  RADIOLOGY:  Ct Abdomen Pelvis W Contrast  03/08/2015  CLINICAL DATA:  Pt to ED via EMS from home c/o abd pain and vomitng that started 3 days ago. Per EMS pt c/o RLQ abd pain with  tenderness to touch. Pt is AANDOx4, speaking in complete and coherent sentences and in NAD at this time. Hx: htn, EXAM: CT ABDOMEN AND PELVIS WITH CONTRAST TECHNIQUE: Multidetector CT imaging of the abdomen and pelvis was performed using the standard protocol following bolus administration of intravenous contrast. CONTRAST:  OMNIPAQUE IOHEXOL 300 MG/ML  SOLN COMPARISON:  None. FINDINGS: Patchy atelectasis/consolidation posteriorly in the lower lobes left worse than right. There is a tiny left pleural effusion. Biatrial enlargement. Small liver. At least 2 large partially calcified stones in the fundus of the gallbladder without wall thickening. Two Wedge-shaped low-attenuation regions in the spleen which is not enlarged. 15 mm enhancing left adrenal nodule. Right adrenal unremarkable. Prominent right extra renal collecting system. Kidneys otherwise unremarkable. Urinary bladder is distended, with a large right posterolateral diverticulum. Coarse calcifications presumably degenerated fibroids in the uterus. Small hiatal hernia. The stomach is non distended. Small bowel decompressed. There is a right Spigelian hernia containing several loops of small bowel and the cecum, without evidence of strangulation or obstruction. Scattered colonic diverticula most numerous in the sigmoid segment without significant adjacent inflammatory/ edematous change. No ascites. No free air. No adenopathy localized. Extensive aortoiliac arterial calcifications without aneurysm. Severe compression fracture deformity of T9, age indeterminate. Moderately severe compression fracture deformity of T12, apparently post attempted vertebroplasty. Advanced facet DJD in the lower lumbar spine. IMPRESSION: 1. Large right Spigelian hernia containing loops of small bowel and the cecum, without  obstruction or strangulation. 2. Cholelithiasis 3. 15 mm enhancing left adrenal nodule suggesting metastasis or pheochromocytoma. 4. Colonic diverticulosis  most most marked in the sigmoid colon 5. T12 compression fracture deformity post intervention. Severe T9 compression fracture, age indeterminate. 6. Bibasilar airspace opacities with small left effusion. 7. Possible splenic infarcts, age indeterminate. Electronically Signed   By: Corlis Leak  Hassell M.D.   On: 03/08/2015 22:28   Dg Abd 2 Views  03/08/2015  CLINICAL DATA:  81 year old female with abdominal pain and vomiting 3 days ago. EXAM: ABDOMEN - 2 VIEW COMPARISON:  None. FINDINGS: Mild gaseous distension of small bowel and colon identified with gas and stool in the rectum. There is no evidence of pneumoperitoneum. No definite suspicious calcifications identified. No acute bony abnormality noted. T12 compression fracture and vertebral augmentation changes noted. IMPRESSION: Mild gaseous distension of small bowel and colon which may represent an ileus. No evidence of bowel obstruction or pneumoperitoneum. Electronically Signed   By: Harmon PierJeffrey  Hu M.D.   On: 03/08/2015 19:48    EKG:  No orders found for this or any previous visit.  IMPRESSION AND PLAN:   81 year old Caucasian female history of chronic atrial fibrillation who is presenting with abdominal pain  1. Urinary tract infection site unspecified: Ceftriaxone and follow culture data 2. Atrial fibrillation rapid ventricular response: Heart rate improved after when necessary dosage Cardizem re-dose if heart rate greater than 120 continue with verapamil 3. Abdominal pain: Suspect mild ileus secondary to urinary tract infection, symptomatic care. 4. Venous embolism prophylactic: Heparin subcutaneous    All the records are reviewed and case discussed with ED provider. Management plans discussed with the patient, family and they are in agreement.  CODE STATUS: Full  TOTAL TIME TAKING CARE OF THIS PATIENT: 35 minutes.    Hower,  Mardi MainlandDavid K M.D on 03/09/2015 at 12:17 AM  Between 7am to 6pm - Pager - [phone]  After 6pm: House Pager: -  [phone]  [facility]  Office  [phone]  CC: Primary care physician; Schuyler AmorWilliam Plonk, MD

## 2015-03-09 NOTE — Progress Notes (Signed)
Per telemetry clerk, patients HR fluctuating between 120-140s now. Dr. Seth BakeV paged and made aware and is to change her daily medications. Will give dose of her regular verapamil now.

## 2015-03-09 NOTE — Care Management (Addendum)
Patient lives with her daughter Mathis Dadjanice Carrier .  They have recently moved th [place] from [place] to be closer  to Ms Carrier's daughter.  Patient does have memory issues and usually "sundowns after 4pm.  Ms Carrier never leaves her mother after 4pm.  Patient is usually oriented and "with it" during the morning and early afternoon.  She is able to ambulate with a walker.  Requires assist with shower but is able to dress and groom.  She does have a wheelchair.  Ms Carrier is in agreement with home health nursing.   She may also benefit from social work for [employer] / support for caregiver.   No agency preference.  has recevied services from [facility] in the past.  Placed an [facility] care giver respite flyer in patient's room for daughter.  Agency received grant to provide caregiver respite.  Confirmed MD, address and working phone number.

## 2015-03-09 NOTE — Progress Notes (Signed)
Pt admitted to 252. Pt alert to self, on 3L of O2. Vital signs stable. Skin assessed with [place]. Safety precautions initiated, bed alarm on. Telemetry box verified. All questions answered. Pt resting comfortably in bed. Will continue to monitor.

## 2015-03-09 NOTE — Progress Notes (Signed)
[facility] - [place] at [facility]   PATIENT NAME: Maureen Arnold    MR#:  [id number]  DATE OF BIRTH:  1913/01/31  SUBJECTIVE:  CHIEF COMPLAINT:   Chief Complaint  Patient presents with  . Abdominal Pain  . Emesis   patient is a 81 year old Caucasian female with past medical history significant for history of chronic atrial fibrillation, essential hypertension, who presents to the hospital with complaints of abdominal pain .  Apparently patient has been having 3 day duration of abdominal pain and decreased oral intake. On arrival to emergency room, patient's abdominal CT revealed large spaghetti and hernia containing loops of small bowel and cecum but no obstruction or strangulation was noted. Patient denies any abdominal pain during my evaluation. However, upon palpation. She has some discomfort. Patient was also noted to be in A. fib, RVR with heart rate ranging to 140s. Labs also revealed pyuria, concerning for urinary tract infection. Urine cultures are taken, pending, patient is on Rocephin. She feels comfortable  Review of Systems  Constitutional: Negative for fever, chills and weight loss.  HENT: Negative for congestion.   Eyes: Negative for blurred vision and double vision.  Respiratory: Negative for cough, sputum production, shortness of breath and wheezing.   Cardiovascular: Negative for chest pain, palpitations, orthopnea, leg swelling and PND.  Gastrointestinal: Positive for abdominal pain. Negative for nausea, vomiting, diarrhea, constipation and blood in stool.  Genitourinary: Negative for dysuria, urgency, frequency and hematuria.  Musculoskeletal: Negative for falls.  Neurological: Negative for dizziness, tremors, focal weakness and headaches.  Endo/Heme/Allergies: Does not bruise/bleed easily.  Psychiatric/Behavioral: Negative for depression. The patient does not have insomnia.     VITAL SIGNS: Blood pressure 114/65, pulse 118, temperature  98.2 F (36.8 C), temperature source Oral, resp. rate 20, height  (1.499 m), weight 54.704 kg (120 lb 9.6 oz), SpO2 91 %.  PHYSICAL EXAMINATION:   GENERAL:  81 y.o.-year-old patient lying in the bed with no acute distress.  EYES: Pupils equal, round, reactive to light and accommodation. No scleral icterus. Extraocular muscles intact.  HEENT: Head atraumatic, normocephalic. Oropharynx and nasopharynx clear.  NECK:  Supple, no jugular venous distention. No thyroid enlargement, no tenderness.  LUNGS: Normal breath sounds bilaterally, no wheezing, rales,rhonchi or crepitation. No use of accessory muscles of respiration.  CARDIOVASCULAR: S1, S2 normal. No murmurs, rubs, or gallops.  ABDOMEN: Soft, mildly tender in right lower quadrant as well as left lower quadrant but no rebound or guarding were noted, nondistended. Bowel sounds present. No organomegaly or mass.  EXTREMITIES: No pedal edema, cyanosis, or clubbing.  NEUROLOGIC: Cranial nerves II through XII are intact. Muscle strength 5/5 in all extremities. Sensation intact. Gait not checked.  PSYCHIATRIC: The patient is alert and oriented x 3.  SKIN: No obvious rash, lesion, or ulcer.   ORDERS/RESULTS REVIEWED:   CBC  Recent Labs Lab 03/08/15 2006  WBC 7.5  HGB 15.2  HCT 45.3  PLT 148*  MCV 93.8  MCH 31.4  MCHC 33.5  RDW 13.2  LYMPHSABS 1.2  MONOABS 0.7  EOSABS 0.1  BASOSABS 0.0   ------------------------------------------------------------------------------------------------------------------  Chemistries   Recent Labs Lab 03/08/15 2006 03/09/15 1249  NA 139  --   K 3.4*  --   CL 106  --   CO2 25  --   GLUCOSE 91  --   BUN 16  --   CREATININE 0.55  --   CALCIUM 9.5  --   AST 16 21  ALT 11*  12*  ALKPHOS 54 52  BILITOT 1.7* 1.5*   ------------------------------------------------------------------------------------------------------------------ estimated creatinine clearance is 28.9 mL/min (by C-G formula  based on Cr of 0.55). ------------------------------------------------------------------------------------------------------------------ No results for input(s): TSH, T4TOTAL, T3FREE, THYROIDAB in the last 72 hours.  Invalid input(s): FREET3  Cardiac Enzymes  Recent Labs Lab 03/08/15 2006  TROPONINI <0.03   ------------------------------------------------------------------------------------------------------------------ Invalid input(s): POCBNP ---------------------------------------------------------------------------------------------------------------  RADIOLOGY: Ct Abdomen Pelvis W Contrast  03/08/2015  CLINICAL DATA:  Pt to ED via EMS from home c/o abd pain and vomitng that started 3 days ago. Per EMS pt c/o RLQ abd pain with tenderness to touch. Pt is AANDOx4, speaking in complete and coherent sentences and in NAD at this time. Hx: htn, EXAM: CT ABDOMEN AND PELVIS WITH CONTRAST TECHNIQUE: Multidetector CT imaging of the abdomen and pelvis was performed using the standard protocol following bolus administration of intravenous contrast. CONTRAST:  OMNIPAQUE IOHEXOL 300 MG/ML  SOLN COMPARISON:  None. FINDINGS: Patchy atelectasis/consolidation posteriorly in the lower lobes left worse than right. There is a tiny left pleural effusion. Biatrial enlargement. Small liver. At least 2 large partially calcified stones in the fundus of the gallbladder without wall thickening. Two Wedge-shaped low-attenuation regions in the spleen which is not enlarged. 15 mm enhancing left adrenal nodule. Right adrenal unremarkable. Prominent right extra renal collecting system. Kidneys otherwise unremarkable. Urinary bladder is distended, with a large right posterolateral diverticulum. Coarse calcifications presumably degenerated fibroids in the uterus. Small hiatal hernia. The stomach is non distended. Small bowel decompressed. There is a right Spigelian hernia containing several loops of small bowel and the  cecum, without evidence of strangulation or obstruction. Scattered colonic diverticula most numerous in the sigmoid segment without significant adjacent inflammatory/ edematous change. No ascites. No free air. No adenopathy localized. Extensive aortoiliac arterial calcifications without aneurysm. Severe compression fracture deformity of T9, age indeterminate. Moderately severe compression fracture deformity of T12, apparently post attempted vertebroplasty. Advanced facet DJD in the lower lumbar spine. IMPRESSION: 1. Large right Spigelian hernia containing loops of small bowel and the cecum, without obstruction or strangulation. 2. Cholelithiasis 3. 15 mm enhancing left adrenal nodule suggesting metastasis or pheochromocytoma. 4. Colonic diverticulosis most most marked in the sigmoid colon 5. T12 compression fracture deformity post intervention. Severe T9 compression fracture, age indeterminate. 6. Bibasilar airspace opacities with small left effusion. 7. Possible splenic infarcts, age indeterminate. Electronically Signed   By: Corlis Leak M.D.   On: 03/08/2015 22:28   Dg Abd 2 Views  03/08/2015  CLINICAL DATA:  81 year old female with abdominal pain and vomiting 3 days ago. EXAM: ABDOMEN - 2 VIEW COMPARISON:  None. FINDINGS: Mild gaseous distension of small bowel and colon identified with gas and stool in the rectum. There is no evidence of pneumoperitoneum. No definite suspicious calcifications identified. No acute bony abnormality noted. T12 compression fracture and vertebral augmentation changes noted. IMPRESSION: Mild gaseous distension of small bowel and colon which may represent an ileus. No evidence of bowel obstruction or pneumoperitoneum. Electronically Signed   By: Harmon Pier M.D.   On: 03/08/2015 19:48    EKG: No orders found for this or any previous visit.  ASSESSMENT AND PLAN:  Principal Problem:   Atrial fibrillation with rapid ventricular response (HCC) Active Problems:   Urinary tract  infection   Abdominal pain, chronic, right lower quadrant   Hypokalemia 1. Abdominal pain of unclear etiology at present, initially thought to be due to ileus, patient had a few bowel movements while in the hospital with suppository,  follow clinically, get KUB in the morning 2. Urinary tract infection, urine cultures are pending. Continue Rocephin 3. A. fib, RVR, likely UTI related, advance patient's verapamil to  120 mg twice daily dose from once daily dose, follow clinically. Advance medications as needed  4. Thrombocytopenia, follow in the morning. Etiology is unclear 5. Hypokalemia, supplemented orally, magnesium level   Management plans discussed with the patient, family and they are in agreement.   DRUG ALLERGIES:  Allergies  Allergen Reactions  . Ibuprofen Other (See Comments)    bleeding    CODE STATUS:     Code Status Orders        Start     Ordered   03/08/15 2351  Full code   Continuous     03/08/15 2351    [facility] Directive Documentation        Most Recent Value   Type of [facility] Directive  Healthcare Power of Attorney, Living will, Out of facility DNR (pink MOST or yellow form)   Pre-existing out of facility DNR order (yellow form or pink MOST form)     "MOST" Form in Place?        TOTAL TIME TAKING CARE OF THIS PATIENT: 40es.    Katharina CaperVAICKUTE,Donta Mcinroy M.D on 03/09/2015 at 3:57 PM  Between 7am to 6pm - Pager - [phone]  After 6pm go to [email or web address] - password [email or web address]  [facility]  Office  [phone]  CC: Primary care physician; Schuyler AmorWilliam Plonk, MD

## 2015-03-09 NOTE — Progress Notes (Signed)
PT HR still 130s-140s, verapamil given 1hr ago with no effect so far. Dr. Seth BakeV paged and made aware; in light of discharge orders, RN expressed discomfort with discharging patient at this time. Per MD, will wait 1 more hour to see if medication takes effect.

## 2015-03-10 ENCOUNTER — Inpatient Hospital Stay: Payer: Medicare Other

## 2015-03-10 NOTE — Progress Notes (Signed)
Patient back in bed resting. Patient up to chair with PT today.  No complaints of abd pain, some abd tenderness in the right lower quadrant. Patient's family at bedside. Patient's family concerned about patients mobility after discharge. Discussed concerns with Dr. Allena KatzPatel at bedside.

## 2015-03-10 NOTE — Progress Notes (Signed)
[facility] - [place] at [facility]   PATIENT NAME: Maureen Arnold    MR#:  [id number]  DATE OF BIRTH:  1913/03/09  SUBJECTIVE:  CHIEF COMPLAINT:   Chief Complaint  Patient presents with  . Abdominal Pain  . Emesis   patient reports no further abdominal pain her daughter is at the bedside and is concerned about her mother going home. She reports that during her previous hospitalizations in [place] she always went to rehabilitation. And is wanting he  mother to go to rehabilitation.   Review of Systems  Constitutional: Negative for fever, chills and weight loss.  positive weakness  HENT: Negative for congestion.   Eyes: Negative for blurred vision and double vision.  Respiratory: Negative for cough, sputum production, shortness of breath and wheezing.   Cardiovascular: Negative for chest pain, palpitations, orthopnea, leg swelling and PND.  Gastrointestinal:  Resolved abdominal pain Negative for nausea, vomiting, diarrhea, constipation and blood in stool.  Genitourinary: Negative for dysuria, urgency, frequency and hematuria.  Musculoskeletal: Negative for falls.  Neurological: Negative for dizziness, tremors, focal weakness and headaches.  Endo/Heme/Allergies: Does not bruise/bleed easily.  Psychiatric/Behavioral: Negative for depression. The patient does not have insomnia.     VITAL SIGNS: Blood pressure 105/45, pulse 96, temperature 98.5 F (36.9 C), temperature source Oral, resp. rate 20, height  (1.499 m), weight 54.704 kg (120 lb 9.6 oz), SpO2 89 %.  PHYSICAL EXAMINATION:   GENERAL:  81 y.o.-year-old patient lying in the bed with no acute distress.  EYES: Pupils equal, round, reactive to light and accommodation. No scleral icterus. Extraocular muscles intact.  HEENT: Head atraumatic, normocephalic. Oropharynx and nasopharynx clear.  NECK:  Supple, no jugular venous distention. No thyroid enlargement, no tenderness.  LUNGS: Normal breath  sounds bilaterally, no wheezing, rales,rhonchi or crepitation. No use of accessory muscles of respiration.  CARDIOVASCULAR: S1, S2 normal. No murmurs, rubs, or gallops.  ABDOMEN: Soft, mildly tender in right lower quadrant as well as left lower quadrant but no rebound or guarding were noted, nondistended. Bowel sounds present. No organomegaly or mass.  EXTREMITIES: No pedal edema, cyanosis, or clubbing.  NEUROLOGIC: Cranial nerves II through XII are intact. Muscle strength 5/5 in all extremities. Sensation intact. Gait not checked.  PSYCHIATRIC: The patient is alert and oriented x 3.  SKIN: No obvious rash, lesion, or ulcer.   ORDERS/RESULTS REVIEWED:   CBC  Recent Labs Lab 03/08/15 2006  WBC 7.5  HGB 15.2  HCT 45.3  PLT 148*  MCV 93.8  MCH 31.4  MCHC 33.5  RDW 13.2  LYMPHSABS 1.2  MONOABS 0.7  EOSABS 0.1  BASOSABS 0.0   ------------------------------------------------------------------------------------------------------------------  Chemistries   Recent Labs Lab 03/08/15 2006 03/09/15 1249  NA 139  --   K 3.4*  --   CL 106  --   CO2 25  --   GLUCOSE 91  --   BUN 16  --   CREATININE 0.55  --   CALCIUM 9.5  --   MG  --  1.7  AST 16 21  ALT 11* 12*  ALKPHOS 54 52  BILITOT 1.7* 1.5*   ------------------------------------------------------------------------------------------------------------------ estimated creatinine clearance is 28.9 mL/min (by C-G formula based on Cr of 0.55). ------------------------------------------------------------------------------------------------------------------ No results for input(s): TSH, T4TOTAL, T3FREE, THYROIDAB in the last 72 hours.  Invalid input(s): FREET3  Cardiac Enzymes  Recent Labs Lab 03/08/15 2006  TROPONINI <0.03   ------------------------------------------------------------------------------------------------------------------ Invalid input(s):  POCBNP ---------------------------------------------------------------------------------------------------------------  RADIOLOGY: Dg Abd 1 View  03/10/2015  CLINICAL DATA:  Abdominal pain. EXAM: ABDOMEN - 1 VIEW COMPARISON:  CT, 03/08/2015. FINDINGS: There is no bowel dilation to suggest obstruction. Bowel gas within the right lower quadrant hernia is noted. Residual contrast is noted in a normal caliber colon. There are scattered vascular calcifications. Gallstones noted on CT are not evident radiographically. Soft tissues are otherwise unremarkable. The bony structures are demineralized with degenerative changes and lower thoracic spine compression fractures, stable from the previous day's CT. IMPRESSION: 1. No evidence of bowel obstruction.  No acute findings. Electronically Signed   By: Amie Portland M.D.   On: 03/10/2015 10:41   Ct Abdomen Pelvis W Contrast  03/08/2015  CLINICAL DATA:  Pt to ED via EMS from home c/o abd pain and vomitng that started 3 days ago. Per EMS pt c/o RLQ abd pain with tenderness to touch. Pt is AANDOx4, speaking in complete and coherent sentences and in NAD at this time. Hx: htn, EXAM: CT ABDOMEN AND PELVIS WITH CONTRAST TECHNIQUE: Multidetector CT imaging of the abdomen and pelvis was performed using the standard protocol following bolus administration of intravenous contrast. CONTRAST:  OMNIPAQUE IOHEXOL 300 MG/ML  SOLN COMPARISON:  None. FINDINGS: Patchy atelectasis/consolidation posteriorly in the lower lobes left worse than right. There is a tiny left pleural effusion. Biatrial enlargement. Small liver. At least 2 large partially calcified stones in the fundus of the gallbladder without wall thickening. Two Wedge-shaped low-attenuation regions in the spleen which is not enlarged. 15 mm enhancing left adrenal nodule. Right adrenal unremarkable. Prominent right extra renal collecting system. Kidneys otherwise unremarkable. Urinary bladder is distended, with a large  right posterolateral diverticulum. Coarse calcifications presumably degenerated fibroids in the uterus. Small hiatal hernia. The stomach is non distended. Small bowel decompressed. There is a right Spigelian hernia containing several loops of small bowel and the cecum, without evidence of strangulation or obstruction. Scattered colonic diverticula most numerous in the sigmoid segment without significant adjacent inflammatory/ edematous change. No ascites. No free air. No adenopathy localized. Extensive aortoiliac arterial calcifications without aneurysm. Severe compression fracture deformity of T9, age indeterminate. Moderately severe compression fracture deformity of T12, apparently post attempted vertebroplasty. Advanced facet DJD in the lower lumbar spine. IMPRESSION: 1. Large right Spigelian hernia containing loops of small bowel and the cecum, without obstruction or strangulation. 2. Cholelithiasis 3. 15 mm enhancing left adrenal nodule suggesting metastasis or pheochromocytoma. 4. Colonic diverticulosis most most marked in the sigmoid colon 5. T12 compression fracture deformity post intervention. Severe T9 compression fracture, age indeterminate. 6. Bibasilar airspace opacities with small left effusion. 7. Possible splenic infarcts, age indeterminate. Electronically Signed   By: Corlis Leak M.D.   On: 03/08/2015 22:28   Dg Abd 2 Views  03/08/2015  CLINICAL DATA:  81 year old female with abdominal pain and vomiting 3 days ago. EXAM: ABDOMEN - 2 VIEW COMPARISON:  None. FINDINGS: Mild gaseous distension of small bowel and colon identified with gas and stool in the rectum. There is no evidence of pneumoperitoneum. No definite suspicious calcifications identified. No acute bony abnormality noted. T12 compression fracture and vertebral augmentation changes noted. IMPRESSION: Mild gaseous distension of small bowel and colon which may represent an ileus. No evidence of bowel obstruction or pneumoperitoneum.  Electronically Signed   By: Harmon Pier M.D.   On: 03/08/2015 19:48    EKG: No orders found for this or any previous visit.  ASSESSMENT AND PLAN:  1. Abdominal pain  could be related to the abdominal hernia she has  now resolved based on her age she is not a candidate for any type of intervention  2. Urinary tract infection, due to Escherichia coli sensitive to cephalosporin 3. A. fib, RVR, likely UTI related, continue verapamil change the dose 280 from twice a day  4. Thrombocytopenia, chronic ITP  5. Hypokalemia, supplemented orally, and follow levels in the morning  Discussed with daughter physical therapy evaluation   DRUG ALLERGIES:  Allergies  Allergen Reactions  . Ibuprofen Other (See Comments)    bleeding    CODE STATUS:     Code Status Orders        Start     Ordered   03/08/15 2351  Full code   Continuous     03/08/15 2351    Advance Directive Documentation        Most Recent Value   Type of Advance Directive  Healthcare Power of Attorney, Living will, Out of facility DNR (pink MOST or yellow form)   Pre-existing out of facility DNR order (yellow form or pink MOST form)     "MOST" Form in Place?        TOTAL TIME TAKING CARE OF THIS PATIENT:  35 minutes  Auburn BilberryPATEL, Ceria Suminski M.D on 03/10/2015 at 2:26 PM  Between 7am to 6pm - Pager - [phone]  After 6pm go to [email or web address] - password [email or web address] [facility]  [facility]  Office  [phone]  CC: Primary care physician; Schuyler AmorWilliam Plonk, MD

## 2015-03-10 NOTE — Progress Notes (Signed)
Physical Therapy Evaluation Patient Details Name: Maureen Arnold MRN: [id number] DOB: 07-21-1912 Today's Date: 03/10/2015   History of Present Illness  Patient is a 81 y.o. female admitted on 11/17 for increased abdominal pain. Was found to have a UTI. Patient has PMH of HTN and chronic atrial fibrillation.  Clinical Impression  Patient is a pleasant female one month short of 81 y.o. Patient previously lived at home with daughter who helped her perform ADLs, including bathing with moderate difficulty. Patient's daughter reports that patient had HHPT in past with some success; however, she notes that she is having difficulty providing required assistance for all other activities. Because patient is limited in muscle strength/endurance, mobility, gait, and function, it is believed that patient would benefit from continued PT services. Patient would benefit from SNF at d/c to improve mobility and function, decreasing stress on caregiver.    Follow Up Recommendations SNF    Equipment Recommendations  Rolling walker with 5" wheels    Recommendations for Other Services       Precautions / Restrictions Precautions Precautions: Fall Restrictions Weight Bearing Restrictions: No      Mobility  Bed Mobility Overal bed mobility: Needs Assistance Bed Mobility: Supine to Sit     Supine to sit: Min assist     General bed mobility comments: Patient requires minimal assistance pushing through UE to raise trunk upright. Utilizes bed rails.  Transfers Overall transfer level: Needs assistance Equipment used: Rolling walker (2 wheeled) Transfers: Sit to/from Stand Sit to Stand: Min assist         General transfer comment: Patient requires verbal cues for proper hand placement to move from sit to stand.   Ambulation/Gait Ambulation/Gait assistance: Min assist Ambulation Distance (Feet): 15 Feet Assistive device: Rolling walker (2 wheeled) Gait Pattern/deviations: Shuffle      General Gait Details: Patient ambulates at decreased cadence with RW. Requires minimal assistance to steer RW in room.  Stairs            Wheelchair Mobility    Modified Rankin (Stroke Patients Only)       Balance Overall balance assessment: Needs assistance Sitting-balance support: Feet supported Sitting balance-Leahy Scale: Good     Standing balance support: Bilateral upper extremity supported Standing balance-Leahy Scale: Fair Standing balance comment: Forward trunk lean                             Pertinent Vitals/Pain Pain Assessment: No/denies pain    Home Living Family/patient expects to be discharged to:: Private residence Living Arrangements: Children Available Help at Discharge: Family;Available PRN/intermittently Type of Home: House Home Access: Level entry     Home Layout: One level Home Equipment: Walker - 4 wheels;Transport chair      Prior Function Level of Independence: Needs assistance   Gait / Transfers Assistance Needed: Patient able to perform transfers and short ambulatory distances with rollator and supervision from daughter.  ADL's / Homemaking Assistance Needed: Performed by daughter        Hand Dominance        Extremity/Trunk Assessment   Upper Extremity Assessment: Overall WFL for tasks assessed           Lower Extremity Assessment: Generalized weakness         Communication   Communication: No difficulties  Cognition Arousal/Alertness: Awake/alert Behavior During Therapy: WFL for tasks assessed/performed Overall Cognitive Status: History of cognitive impairments - at baseline  Memory: Decreased short-term memory              General Comments      Exercises        Assessment/Plan    PT Assessment Patient needs continued PT services  PT Diagnosis Difficulty walking;Generalized weakness   PT Problem List Decreased strength;Decreased range of motion;Decreased activity  tolerance;Decreased balance;Decreased mobility;Decreased cognition;Decreased knowledge of use of DME;Decreased safety awareness  PT Treatment Interventions Gait training;DME instruction;Functional mobility training;Therapeutic activities;Therapeutic exercise;Balance training;Patient/family education;Cognitive remediation   PT Goals (Current goals can be found in the Care Plan section) Acute Rehab PT Goals Patient Stated Goal: "To go home" PT Goal Formulation: With patient/family Time For Goal Achievement: 03/24/15 Potential to Achieve Goals: Good    Frequency Min 2X/week   Barriers to discharge Decreased caregiver support      Co-evaluation               End of Session Equipment Utilized During Treatment: Gait belt Activity Tolerance: Patient tolerated treatment well Patient left: in chair;with call bell/phone within reach;with chair alarm set;with family/visitor present Nurse Communication: Mobility status         Time: 1415-1445 PT Time Calculation (min) (ACUTE ONLY): 30 min   Charges:   PT Evaluation $Initial PT Evaluation Tier I: 1 Procedure     PT G Codes:        Neita CarpJulie Ann Kaydi Kley, PT, DPT 03/10/2015, 2:58 PM

## 2015-03-11 LAB — MAGNESIUM: MAGNESIUM: 1.9 mg/dL (ref 1.7–2.4)

## 2015-03-11 LAB — BASIC METABOLIC PANEL
Anion gap: 5 (ref 5–15)
BUN: 13 mg/dL (ref 6–20)
CALCIUM: 9.3 mg/dL (ref 8.9–10.3)
CHLORIDE: 106 mmol/L (ref 101–111)
CO2: 27 mmol/L (ref 22–32)
CREATININE: 0.67 mg/dL (ref 0.44–1.00)
Glucose, Bld: 112 mg/dL — ABNORMAL HIGH (ref 65–99)
Potassium: 4.1 mmol/L (ref 3.5–5.1)
Sodium: 138 mmol/L (ref 135–145)

## 2015-03-11 LAB — URINE CULTURE: Culture: >=100000

## 2015-03-11 LAB — PLATELET COUNT: PLATELETS: 150 10*3/uL (ref 150–440)

## 2015-03-11 MED ORDER — ENSURE ENLIVE PO LIQD: 237.0000 mL | Freq: Every day | ORAL | Status: DC

## 2015-03-11 MED ORDER — ALUM & MAG HYDROXIDE-SIMETH 200-200-20 MG/5ML PO SUSP
30.0000 mL | Freq: Three times a day (TID) | ORAL | Status: DC
  Administered 2015-03-11 – 2015-03-12 (×3): 30 mL via ORAL
  Filled 2015-03-11 (×3): qty 30

## 2015-03-11 MED ORDER — POLYETHYLENE GLYCOL 3350 17 G PO PACK
17.0000 g | PACK | Freq: Every day | ORAL | Status: DC
  Administered 2015-03-11 – 2015-03-12 (×2): 17 g via ORAL
  Filled 2015-03-11 (×2): qty 1

## 2015-03-11 NOTE — [place] FL2 (Signed)
[place] MEDICAID FL2 LEVEL OF CARE SCREENING TOOL     IDENTIFICATION  Patient Name: Maureen BenchJanice Arnold Birthdate: 11/18/1912 Sex: female Admission Date (Current Location): 03/08/2015  [place] [facility] [place]    Facility and Address:  [facility], [address], [place], [place] [address]      Provider Number: [id number]  Attending Physician Name and Address:  Auburn BilberryShreyang Patel, MD  Relative Name and Phone Number:       Current Level of Care: Hospital Recommended Level of Care: Skilled Nursing Facility Prior Approval Number:    Date Approved/Denied:   PASRR Number:    Discharge Plan: SNF    Current Diagnoses: Patient Active Problem List   Diagnosis Date Noted  . Abdominal pain, chronic, right lower quadrant 03/09/2015  . Hypokalemia 03/09/2015  . Atrial fibrillation with rapid ventricular response (HCC) 03/08/2015  . Urinary tract infection 03/08/2015  . Chronic atrial fibrillation (HCC) 11/24/2014  . Gait instability 11/24/2014  . Short-term memory loss 11/24/2014  . Bilateral lower extremity edema 11/24/2014  . Glaucoma 11/24/2014  . Macular degeneration 11/24/2014  . Vitamin D deficiency 11/24/2014    Orientation ACTIVITIES/SOCIAL BLADDER RESPIRATION    Self, Situation, Place  Family supportive, Active Incontinent O2 (As needed)  BEHAVIORAL SYMPTOMS/MOOD NEUROLOGICAL BOWEL NUTRITION STATUS      Continent Diet (heart health)  PHYSICIAN VISITS COMMUNICATION OF NEEDS Height & Weight Skin  30 days Verbally   120 lbs. Normal          AMBULATORY STATUS RESPIRATION    Assist extensive O2 (As needed)      Personal Care Assistance Level of Assistance  Bathing, Dressing Bathing Assistance: Limited assistance   Dressing Assistance: Limited assistance      Functional Limitations Info  Sight Sight Info: Impaired           SPECIAL CARE FACTORS FREQUENCY  PT (By licensed PT), OT (By licensed OT)     PT  Frequency: 5 OT Frequency: 3           Additional Factors Info  Code Status, Allergies Code Status Info: DNR Allergies Info: Ibuprofen           Current Medications (03/11/2015): Current Facility-Administered Medications  Medication Dose Route Frequency Provider Last Rate Last Dose  . acetaminophen (TYLENOL) tablet 650 mg  650 mg Oral Q6H PRN Wyatt Hasteavid K Hower, MD       Or  . acetaminophen (TYLENOL) suppository 650 mg  650 mg Rectal Q6H PRN Wyatt Hasteavid K Hower, MD      . alum & mag hydroxide-simeth (MAALOX/MYLANTA) 200-200-20 MG/5ML suspension 30 mL  30 mL Oral 3 times per day Auburn BilberryShreyang Patel, MD   30 mL at 03/11/15 1437  . aspirin chewable tablet 81 mg  81 mg Oral QHS Wyatt Hasteavid K Hower, MD   81 mg at 03/10/15 2127  . cefTRIAXone (ROCEPHIN) 1 g in dextrose 5 % 50 mL IVPB  1 g Intravenous Q24H Wyatt Hasteavid K Hower, MD   1 g at 03/11/15 0120  . cholecalciferol (VITAMIN D) tablet 1,000 Units  1,000 Units Oral QHS Wyatt Hasteavid K Hower, MD   1,000 Units at 03/10/15 2127  . [START ON 03/12/2015] feeding supplement (ENSURE ENLIVE) (ENSURE ENLIVE) liquid 237 mL  237 mL Oral Q breakfast Auburn BilberryShreyang Patel, MD      . heparin injection 5,000 Units  5,000 Units Subcutaneous 3 times per day Wyatt Hasteavid K Hower, MD   5,000 Units at 03/11/15 1437  . latanoprost (XALATAN)  0.005 % ophthalmic solution 1 drop  1 drop Left Eye QHS Wyatt Haste, MD   1 drop at 03/10/15 2200  . morphine 2 MG/ML injection 1 mg  1 mg Intravenous Q4H PRN Wyatt Haste, MD      . morphine 2 MG/ML injection 2 mg  2 mg Intravenous Once Emily Filbert, MD   2 mg at 03/08/15 2003  . ondansetron (ZOFRAN) tablet 4 mg  4 mg Oral Q6H PRN Wyatt Haste, MD   4 mg at 03/11/15 0139   Or  . ondansetron [facility]) injection 4 mg  4 mg Intravenous Q6H PRN Wyatt Haste, MD      . oxyCODONE (Oxy IR/ROXICODONE) immediate release tablet 5 mg  5 mg Oral Q4H PRN Wyatt Haste, MD      . polyethylene glycol (MIRALAX / GLYCOLAX) packet 17 g  17 g Oral Daily Auburn Bilberry, MD    17 g at 03/11/15 1203  . sodium chloride 0.9 % injection 3 mL  3 mL Intravenous Q12H Wyatt Haste, MD   3 mL at 03/11/15 0918  . verapamil (CALAN-SR) CR tablet 120 mg  120 mg Oral BID Katharina Caper, MD   120 mg at 03/11/15 1914   Do not use this list as official medication orders. Please verify with discharge summary.  Discharge Medications:   Medication List    TAKE these medications        aspirin 81 MG tablet  Take 1 tablet (81 mg total) by mouth daily.     cephALEXin 250 MG capsule  Commonly known as:  KEFLEX  Take 1 capsule (250 mg total) by mouth 3 (three) times daily.     docusate sodium 100 MG capsule  Commonly known as:  COLACE  Take 1 capsule (100 mg total) by mouth 2 (two) times daily.     latanoprost 0.005 % ophthalmic solution  Commonly known as:  XALATAN  Place 1 drop into the left eye at bedtime.     verapamil 240 MG CR tablet  Commonly known as:  CALAN-SR  Take 0.5 tablets (120 mg total) by mouth 2 (two) times daily.     Vitamin D3 1000 UNITS Caps  Take 1 capsule (1,000 Units total) by mouth daily.        Relevant Imaging Results:  Relevant Lab Results:  Recent Labs    Additional Information    Ned Card, LCSW

## 2015-03-11 NOTE — Progress Notes (Signed)
[facility] - [place] at [facility]   PATIENT NAME: Maureen Arnold    MR#:  [id number]  DATE OF BIRTH:  [birth year]  SUBJECTIVE:  CHIEF COMPLAINT:   Chief Complaint  Patient presents with  . Abdominal Pain  . Emesis     Complains of feeling gas in her abdomen. Denies any other complaints.   Review of Systems  Constitutional: Negative for fever, chills and weight loss.  positive weakness  HENT: Negative for congestion.   Eyes: Negative for blurred vision and double vision.  Respiratory: Negative for cough, sputum production, shortness of breath and wheezing.   Cardiovascular: Negative for chest pain, palpitations, orthopnea, leg swelling and PND.  Gastrointestinal:  Abdominal gas Negative for nausea, vomiting, diarrhea, constipation and blood in stool.  Genitourinary: Negative for dysuria, urgency, frequency and hematuria.  Musculoskeletal: Negative for falls.  Neurological: Negative for dizziness, tremors, focal weakness and headaches.  Endo/Heme/Allergies: Does not bruise/bleed easily.  Psychiatric/Behavioral: Negative for depression. The patient does not have insomnia.     VITAL SIGNS: Blood pressure 133/51, pulse 101, temperature 97.8 F (36.6 C), temperature source Oral, resp. rate 18, height  (1.499 m), weight 54.704 kg (120 lb 9.6 oz), SpO2 95 %.  PHYSICAL EXAMINATION:   GENERAL:  81 y.o.-year-old patient lying in the bed with no acute distress.  EYES: Pupils equal, round, reactive to light and accommodation. No scleral icterus. Extraocular muscles intact.  HEENT: Head atraumatic, normocephalic. Oropharynx and nasopharynx clear.  NECK:  Supple, no jugular venous distention. No thyroid enlargement, no tenderness.  LUNGS: Normal breath sounds bilaterally, no wheezing, rales,rhonchi or crepitation. No use of accessory muscles of respiration.  CARDIOVASCULAR: S1, S2 normal. No murmurs, rubs, or gallops.  ABDOMEN: Soft, mildly tender in right  lower quadrant as well as left lower quadrant but no rebound or guarding were noted, nondistended. Bowel sounds present. No organomegaly or mass.  EXTREMITIES: No pedal edema, cyanosis, or clubbing.  NEUROLOGIC: Cranial nerves II through XII are intact. Muscle strength 5/5 in all extremities. Sensation intact. Gait not checked.  PSYCHIATRIC: The patient is alert and oriented x 3.  SKIN: No obvious rash, lesion, or ulcer.   ORDERS/RESULTS REVIEWED:   CBC  Recent Labs Lab 03/08/15 2006 03/11/15 0536  WBC 7.5  --   HGB 15.2  --   HCT 45.3  --   PLT 148* 150  MCV 93.8  --   MCH 31.4  --   MCHC 33.5  --   RDW 13.2  --   LYMPHSABS 1.2  --   MONOABS 0.7  --   EOSABS 0.1  --   BASOSABS 0.0  --    ------------------------------------------------------------------------------------------------------------------  Chemistries   Recent Labs Lab 03/08/15 2006 03/09/15 1249 03/11/15 0536  NA 139  --  138  K 3.4*  --  4.1  CL 106  --  106  CO2 25  --  27  GLUCOSE 91  --  112*  BUN 16  --  13  CREATININE 0.55  --  0.67  CALCIUM 9.5  --  9.3  MG  --  1.7 1.9  AST 16 21  --   ALT 11* 12*  --   ALKPHOS 54 52  --   BILITOT 1.7* 1.5*  --    ------------------------------------------------------------------------------------------------------------------ estimated creatinine clearance is 28.9 mL/min (by C-G formula based on Cr of 0.67). ------------------------------------------------------------------------------------------------------------------ No results for input(s): TSH, T4TOTAL, T3FREE, THYROIDAB in the last 72 hours.  Invalid input(s): FREET3  Cardiac Enzymes  Recent Labs Lab 03/08/15 2006  TROPONINI <0.03   ------------------------------------------------------------------------------------------------------------------ Invalid input(s):  POCBNP ---------------------------------------------------------------------------------------------------------------  RADIOLOGY: Dg Abd 1 View  03/10/2015  CLINICAL DATA:  Abdominal pain. EXAM: ABDOMEN - 1 VIEW COMPARISON:  CT, 03/08/2015. FINDINGS: There is no bowel dilation to suggest obstruction. Bowel gas within the right lower quadrant hernia is noted. Residual contrast is noted in a normal caliber colon. There are scattered vascular calcifications. Gallstones noted on CT are not evident radiographically. Soft tissues are otherwise unremarkable. The bony structures are demineralized with degenerative changes and lower thoracic spine compression fractures, stable from the previous day's CT. IMPRESSION: 1. No evidence of bowel obstruction.  No acute findings. Electronically Signed   By: Amie Portlandavid  Ormond M.D.   On: 03/10/2015 10:41    EKG: No orders found for this or any previous visit.  ASSESSMENT AND PLAN:  1. Abdominal pain  could be related to the abdominal hernia , also could be related to flatulence, I will put her on scheduled simethicone 2. Urinary tract infection, due to Escherichia coli sensitive to cephalosporin continue for 1 more day changed to Ceftin tomorrow 3. A. fib, RVR, likely UTI related, continue verapamil  4. Thrombocytopenia, chronic ITP  5. Hypokalemia, supplemented orally, now resolved  Seen by physical therapy skilled nursing placement recommended   DRUG ALLERGIES:  Allergies  Allergen Reactions  . Ibuprofen Other (See Comments)    bleeding    CODE STATUS:     Code Status Orders        Start     Ordered   03/08/15 2351  Full code   Continuous     03/08/15 2351    Advance Directive Documentation        Most Recent Value   Type of Advance Directive  Healthcare Power of Attorney, Living will, Out of facility DNR (pink MOST or yellow form)   Pre-existing out of facility DNR order (yellow form or pink MOST form)     "MOST" Form in Place?         TOTAL TIME TAKING CARE OF THIS PATIENT:  32 minutes  Auburn BilberryPATEL, Gerard Cantara M.D on 03/11/2015 at 2:01 PM  Between 7am to 6pm - Pager - [phone]  After 6pm go to [email or web address] - password [email or web address]  [facility]  Office  [phone]  CC: Primary care physician; Schuyler AmorWilliam Plonk, MD

## 2015-03-11 NOTE — Progress Notes (Signed)
Per Clois Comberara M. Patient's daughter stated that the patient is a DNR. Notified Dr. Allena KatzPatel.

## 2015-03-11 NOTE — Clinical Social Work Note (Signed)
Clinical Social Work Assessment  Patient Details  Name: Maureen BenchJanice Ashbaugh MRN: [id number] Date of Birth: 08-May-1912  Date of referral:  03/11/15               Reason for consult:  Facility Placement                Permission sought to share information with:  Case Manager, Family Supports, Magazine features editoracility Contact Representative Permission granted to share information::  Yes, Verbal Permission Granted  Name::     Daughter Jan Carrier [phone]  Agency::  SNFs  Relationship::  daughter  Contact Information:     Housing/Transportation Living arrangements for the past 2 months:  Single Family Home Source of Information:  Patient, Adult Children, Medical Team Patient Interpreter Needed:  None Criminal Activity/Legal Involvement Pertinent to Current Situation/Hospitalization:  No - Comment as needed Significant Relationships:  None Lives with:    Do you feel safe going back to the place where you live?  No Need for family participation in patient care:  Yes (Comment)  Care giving concerns:  Pt lives with her daughter. [place]    Social Worker assessment / plan:  CSW was referred to Pt to assist with dc planning. Pt is widowed after 50 years of marriage, has 3 children and several grandchildren, great grandchildren, and even a great great grandbaby she has yet to meet per her report. Pt's daughter Vanessa KickJan is Pt's main caregiver. CSW discussed with Pt and her daughter the recommendation of STR at SNF. Pt has been to SNF before in [place]. Pt's and her daughter are in agreement to Pt going to STR at dc.   Employment status:  Retired Health and safety inspectornsurance information:  Medicare PT Recommendations:  Skilled Nursing Facility Information / Referral to community resources:  Skilled Nursing Facility  Patient/Family's Response to care:  Pt and her daughter are engaged with providers. Pt's daughter is hopeful for Pt's recovery.   Patient/Family's Understanding of and Emotional  Response to Diagnosis, Current Treatment, and Prognosis:  CSW discussed with Pt's daughter, Pt's code status. Pt's daughter stated that there is a mistake because they do not want any life saving measures taken and Pt is a DNR and has been for 15 years. Pt's daughter states that they have a DNR at home on the door where their PCP told them to keep it. CSW will updated RN and MD.   Emotional Assessment Appearance:  Appears stated age Attitude/Demeanor/Rapport:   (pleasant) Affect (typically observed):  Accepting Orientation:  Oriented to Situation, Oriented to Place, Oriented to  Time, Oriented to Self Alcohol / Substance use:  Never Used Psych involvement (Current and /or in the community):  No (Comment)  Discharge Needs  Concerns to be addressed:  Discharge Planning Concerns Readmission within the last 30 days:  No Current discharge risk:  Dependent with Mobility Barriers to Discharge:  Continued Medical Work up   [employer]ryker Corporationara N Kirtis Challis, LCSW 03/11/2015, 10:03 PM

## 2015-03-11 NOTE — Clinical Social Work Placement (Signed)
   CLINICAL SOCIAL WORK PLACEMENT  NOTE  Date:  03/11/2015  Patient Details  Name: Maureen Arnold MRN: [id number] Date of Birth: 1913/01/02  Clinical Social Work is seeking post-discharge placement for this patient at the Skilled  Nursing Facility level of care (*CSW will initial, date and re-position this form in  chart as items are completed):  Yes   Patient/family provided with [place] Clinical Social Work Department's list of facilities offering this level of care within the geographic area requested by the patient (or if unable, by the patient's family).  Yes   Patient/family informed of their freedom to choose among providers that offer the needed level of care, that participate in Medicare, Medicaid or managed care program needed by the patient, have an available bed and are willing to accept the patient.  Yes   Patient/family informed of [facility]'s ownership interest in [facility] and [facility], as well as of the fact that they are under no obligation to receive care at these facilities.  PASRR submitted to EDS on       PASRR number received on       Existing PASRR number confirmed on       FL2 transmitted to all facilities in geographic area requested by pt/family on 03/11/15     FL2 transmitted to all facilities within larger geographic area on       Patient informed that his/her managed care company has contracts with or will negotiate with certain facilities, including the following:            Patient/family informed of bed offers received.  Patient chooses bed at       Physician recommends and patient chooses bed at      Patient to be transferred to   on  .  Patient to be transferred to facility by       Patient family notified on   of transfer.  Name of family member notified:        PHYSICIAN Please sign DNR, Please sign FL2, Please prepare priority discharge summary, including medications     Additional Comment:     _______________________________________________ Ned Cardara N Wilburta Milbourn, LCSW 03/11/2015, 4:06 PM

## 2015-03-11 NOTE — Progress Notes (Signed)
Patient has been resting well today in bed/chair. Patient had no complaints of pain.

## 2015-03-12 MED ORDER — CEFUROXIME AXETIL 250 MG PO TABS
500.0000 mg | ORAL_TABLET | Freq: Two times a day (BID) | ORAL | Status: DC
  Administered 2015-03-12: 500 mg via ORAL
  Filled 2015-03-12: qty 2

## 2015-03-12 MED ORDER — POLYETHYLENE GLYCOL 3350 17 G PO PACK: 17.0000 g | PACK | Freq: Every day | ORAL | Status: DC

## 2015-03-12 MED ORDER — CEFUROXIME AXETIL 500 MG PO TABS: 500.0000 mg | ORAL_TABLET | Freq: Two times a day (BID) | ORAL | Status: DC

## 2015-03-12 MED ORDER — VERAPAMIL HCL ER 120 MG PO TBCR: 120.0000 mg | EXTENDED_RELEASE_TABLET | Freq: Two times a day (BID) | ORAL | Status: DC

## 2015-03-12 MED ORDER — ALUM & MAG HYDROXIDE-SIMETH 200-200-20 MG/5ML PO SUSP: 30.0000 mL | Freq: Four times a day (QID) | ORAL | Status: DC | PRN

## 2015-03-12 NOTE — Discharge Summary (Signed)
Maureen Arnold, 81 y.o., DOB [birth year], MRN [id number]. Admission date: 03/08/2015 Discharge Date 03/12/2015 Primary MD Schuyler Amor, MD Admitting Physician Wyatt Haste, MD  Admission Diagnosis  Atrial fibrillation with rapid ventricular response (HCC) [I48.91] Right lower quadrant abdominal pain [R10.31] Cystitis [N30.90]  Discharge Diagnosis   Principal Problem:   Atrial fibrillation with rapid ventricular response (HCC) Active Problems:   Urinary tract infection   Abdominal pain, chronic, right lower quadrant   Hypokalemia  Abdominal hernia        Hospital Course    Maureen Arnold is a 81 y.o. female with a known history of essential hypertension, chronic atrial fibrillation who brought to er with abdominal pain. History aided by daughter present at bedside. They described three-day total duration of abdominal pain intermittent described only as "pain" no worsening or relieving factors intensity4/10. Associated decreased by mouth intake without frank emesis still passing flat is still having bowel movements. On arrival to the emergency department noted to be in atrial fibrillation rapid ventricular response heart rate 130s. Pt had ct scan of abdomen which showed hernia without bowel obstruction. She was also noted to have uti. Pt was cardizem was adjusted, hr stable. Abdominal pain now resolved. She was treated for UTI. She is very weak and need of rehab.              Consults  None  Significant Tests:  See full reports for all details      Dg Abd 1 View  03/10/2015  CLINICAL DATA:  Abdominal pain. EXAM: ABDOMEN - 1 VIEW COMPARISON:  CT, 03/08/2015. FINDINGS: There is no bowel dilation to suggest obstruction. Bowel gas within the right lower quadrant hernia is noted. Residual contrast is noted in a normal caliber colon. There are scattered vascular calcifications. Gallstones noted on CT are not evident radiographically. Soft tissues are otherwise unremarkable.  The bony structures are demineralized with degenerative changes and lower thoracic spine compression fractures, stable from the previous day's CT. IMPRESSION: 1. No evidence of bowel obstruction.  No acute findings. Electronically Signed   By: Amie Portland M.D.   On: 03/10/2015 10:41   Ct Abdomen Pelvis W Contrast  03/08/2015  CLINICAL DATA:  Pt to ED via EMS from home c/o abd pain and vomitng that started 3 days ago. Per EMS pt c/o RLQ abd pain with tenderness to touch. Pt is AANDOx4, speaking in complete and coherent sentences and in NAD at this time. Hx: htn, EXAM: CT ABDOMEN AND PELVIS WITH CONTRAST TECHNIQUE: Multidetector CT imaging of the abdomen and pelvis was performed using the standard protocol following bolus administration of intravenous contrast. CONTRAST:  OMNIPAQUE IOHEXOL 300 MG/ML  SOLN COMPARISON:  None. FINDINGS: Patchy atelectasis/consolidation posteriorly in the lower lobes left worse than right. There is a tiny left pleural effusion. Biatrial enlargement. Small liver. At least 2 large partially calcified stones in the fundus of the gallbladder without wall thickening. Two Wedge-shaped low-attenuation regions in the spleen which is not enlarged. 15 mm enhancing left adrenal nodule. Right adrenal unremarkable. Prominent right extra renal collecting system. Kidneys otherwise unremarkable. Urinary bladder is distended, with a large right posterolateral diverticulum. Coarse calcifications presumably degenerated fibroids in the uterus. Small hiatal hernia. The stomach is non distended. Small bowel decompressed. There is a right Spigelian hernia containing several loops of small bowel and the cecum, without evidence of strangulation or obstruction. Scattered colonic diverticula most numerous in the sigmoid segment without significant adjacent inflammatory/ edematous change. No ascites. No free  air. No adenopathy localized. Extensive aortoiliac arterial calcifications without aneurysm.  Severe compression fracture deformity of T9, age indeterminate. Moderately severe compression fracture deformity of T12, apparently post attempted vertebroplasty. Advanced facet DJD in the lower lumbar spine. IMPRESSION: 1. Large right Spigelian hernia containing loops of small bowel and the cecum, without obstruction or strangulation. 2. Cholelithiasis 3. 15 mm enhancing left adrenal nodule suggesting metastasis or pheochromocytoma. 4. Colonic diverticulosis most most marked in the sigmoid colon 5. T12 compression fracture deformity post intervention. Severe T9 compression fracture, age indeterminate. 6. Bibasilar airspace opacities with small left effusion. 7. Possible splenic infarcts, age indeterminate. Electronically Signed   By: Corlis Leak M.D.   On: 03/08/2015 22:28   Dg Abd 2 Views  03/08/2015  CLINICAL DATA:  81 year old female with abdominal pain and vomiting 3 days ago. EXAM: ABDOMEN - 2 VIEW COMPARISON:  None. FINDINGS: Mild gaseous distension of small bowel and colon identified with gas and stool in the rectum. There is no evidence of pneumoperitoneum. No definite suspicious calcifications identified. No acute bony abnormality noted. T12 compression fracture and vertebral augmentation changes noted. IMPRESSION: Mild gaseous distension of small bowel and colon which may represent an ileus. No evidence of bowel obstruction or pneumoperitoneum. Electronically Signed   By: Harmon Pier M.D.   On: 03/08/2015 19:48       Today   Subjective:   Maureen Arnold  Feels week denies any cp or sob  Objective:   Blood pressure 119/71, pulse 81, temperature 98.4 F (36.9 C), temperature source Oral, resp. rate 18, height  (1.499 m), weight 54.704 kg (120 lb 9.6 oz), SpO2 91 %.  .  Intake/Output Summary (Last 24 hours) at 03/12/15 0938 Last data filed at 03/11/15 2205  Gross per 24 hour  Intake    243 ml  Output     50 ml  Net    193 ml    Exam VITAL SIGNS: Blood pressure 119/71, pulse  81, temperature 98.4 F (36.9 C), temperature source Oral, resp. rate 18, height  (1.499 m), weight 54.704 kg (120 lb 9.6 oz), SpO2 91 %.  GENERAL:  81 y.o.-year-old patient lying in the bed with no acute distress.  EYES: Pupils equal, round, reactive to light and accommodation. No scleral icterus. Extraocular muscles intact.  HEENT: Head atraumatic, normocephalic. Oropharynx and nasopharynx clear.  NECK:  Supple, no jugular venous distention. No thyroid enlargement, no tenderness.  LUNGS: Normal breath sounds bilaterally, no wheezing, rales,rhonchi or crepitation. No use of accessory muscles of respiration.  CARDIOVASCULAR: S1, S2 normal. No murmurs, rubs, or gallops.  ABDOMEN: Soft, nontender, nondistended. Bowel sounds present. No organomegaly or mass.  EXTREMITIES: No pedal edema, cyanosis, or clubbing.  NEUROLOGIC: Cranial nerves II through XII are intact. Muscle strength 5/5 in all extremities. Sensation intact. Gait not checked.  PSYCHIATRIC: The patient is alert and oriented x 3.  SKIN: No obvious rash, lesion, or ulcer.   Data Review     CBC w Diff: Lab Results  Component Value Date   WBC 7.5 03/08/2015   WBC 6.8 11/23/2014   HGB 15.2 03/08/2015   HCT 45.3 03/08/2015   HCT 42.4 11/23/2014   PLT 150 03/11/2015   LYMPHOPCT 16 03/08/2015   MONOPCT 9 03/08/2015   EOSPCT 1 03/08/2015   BASOPCT 1 03/08/2015   CMP: Lab Results  Component Value Date   NA 138 03/11/2015   NA 140 11/23/2014   K 4.1 03/11/2015   CL 106 03/11/2015  CO2 27 03/11/2015   BUN 13 03/11/2015   BUN 13 11/23/2014   CREATININE 0.67 03/11/2015   PROT 5.9* 03/09/2015   PROT 6.0 11/23/2014   ALBUMIN 3.1* 03/09/2015   ALBUMIN 3.5 11/23/2014   BILITOT 1.5* 03/09/2015   BILITOT 0.9 11/23/2014   ALKPHOS 52 03/09/2015   AST 21 03/09/2015   ALT 12* 03/09/2015  .  Micro Results Recent Results (from the past 240 hour(s))  Urine culture     Status: None   Collection Time: 03/08/15  8:32 PM   Result Value Ref Range Status   Specimen Description URINE, CATHETERIZED  Final   Special Requests NONE  Final   Culture >=100,000 COLONIES/mL ESCHERICHIA COLI  Final   Report Status 03/11/2015 FINAL  Final   Organism ID, Bacteria ESCHERICHIA COLI  Final      Susceptibility   Escherichia coli - MIC*    AMPICILLIN <=2 SENSITIVE Sensitive     CEFTAZIDIME <=1 SENSITIVE Sensitive     CEFAZOLIN <=4 SENSITIVE Sensitive     CEFTRIAXONE <=1 SENSITIVE Sensitive     CIPROFLOXACIN <=0.25 SENSITIVE Sensitive     GENTAMICIN >=16 RESISTANT Resistant     IMIPENEM <=0.25 SENSITIVE Sensitive     TRIMETH/SULFA <=20 SENSITIVE Sensitive     NITROFURANTOIN Value in next row Sensitive      SENSITIVE<=16    PIP/TAZO Value in next row Sensitive      SENSITIVE<=4    LEVOFLOXACIN Value in next row Sensitive      SENSITIVE<=0.12    * >=100,000 COLONIES/mL ESCHERICHIA COLI        Code Status Orders        Start     Ordered   03/11/15 1858  Do not attempt resuscitation (DNR)   Continuous    Question Answer Comment  In the event of cardiac or respiratory ARREST Do not call a "code blue"   In the event of cardiac or respiratory ARREST Do not perform Intubation, CPR, defibrillation or ACLS   In the event of cardiac or respiratory ARREST Use medication by any route, position, wound care, and other measures to relive pain and suffering. May use oxygen, suction and manual treatment of airway obstruction as needed for comfort.      03/11/15 1857    Advance Directive Documentation        Most Recent Value   Type of Advance Directive  Healthcare Power of Attorney, Living will, Out of facility DNR (pink MOST or yellow form)   Pre-existing out of facility DNR order (yellow form or pink MOST form)     "MOST" Form in Place?            Follow-up Information    Follow up with Schuyler Amor, MD. Go on 03/20/2015.   Specialty:  Family Medicine   Why:  Time: 11:00 a.m.   Contact information:   [address] [place] [address] [phone]       Discharge Medications     Medication List    TAKE these medications        alum & mag hydroxide-simeth 200-200-20 MG/5ML suspension  Commonly known as:  MAALOX/MYLANTA  Take 30 mLs by mouth every 6 (six) hours as needed for indigestion, heartburn or flatulence.     aspirin 81 MG tablet  Take 1 tablet (81 mg total) by mouth daily.     cefUROXime 500 MG tablet  Commonly known as:  CEFTIN  Take 1 tablet (500 mg  total) by mouth 2 (two) times daily with a meal.     docusate sodium 100 MG capsule  Commonly known as:  COLACE  Take 1 capsule (100 mg total) by mouth 2 (two) times daily.     latanoprost 0.005 % ophthalmic solution  Commonly known as:  XALATAN  Place 1 drop into the left eye at bedtime.     polyethylene glycol packet  Commonly known as:  MIRALAX / GLYCOLAX  Take 17 g by mouth daily.     verapamil 240 MG CR tablet  Commonly known as:  CALAN-SR  Take 0.5 tablets (120 mg total) by mouth 2 (two) times daily.     verapamil 120 MG CR tablet  Commonly known as:  CALAN-SR  Take 1 tablet (120 mg total) by mouth 2 (two) times daily.     Vitamin D3 1000 UNITS Caps  Take 1 capsule (1,000 Units total) by mouth daily.           Total Time in preparing paper work, data evaluation and todays exam - 35 minutes  Auburn BilberryPATEL, Natalin Bible M.D on 03/12/2015 at 9:38 AM  [facility]   Office  [phone]

## 2015-03-12 NOTE — Discharge Instructions (Signed)
°  DIET:  °Cardiac diet ° °DISCHARGE CONDITION:  °Stable ° °ACTIVITY:  °Activity as tolerated ° °OXYGEN:  °Home Oxygen: No. °  °Oxygen Delivery: room air ° °DISCHARGE LOCATION:  °nursing home  ° ° °ADDITIONAL DISCHARGE INSTRUCTION: ° ° °If you experience worsening of your admission symptoms, develop shortness of breath, life threatening emergency, suicidal or homicidal thoughts you must seek medical attention immediately by calling 911 or calling your MD immediately  if symptoms less severe. ° °You Must read complete instructions/literature along with all the possible adverse reactions/side effects for all the Medicines you take and that have been prescribed to you. Take any new Medicines after you have completely understood and accpet all the possible adverse reactions/side effects.  ° °Please note ° °You were cared for by a hospitalist during your hospital stay. If you have any questions about your discharge medications or the care you received while you were in the hospital after you are discharged, you can call the unit and asked to speak with the hospitalist on call if the hospitalist that took care of you is not available. Once you are discharged, your primary care physician will handle any further medical issues. Please note that NO REFILLS for any discharge medications will be authorized once you are discharged, as it is imperative that you return to your primary care physician (or establish a relationship with a primary care physician if you do not have one) for your aftercare needs so that they can reassess your need for medications and monitor your lab values. ° ° °

## 2015-03-12 NOTE — Care Management (Signed)
Patient seen by physical therapy 11/19 and recommending skilled nursing.  CSW has initiated bed search. There have been no changes in cardiac meds in last 48 hours.  Patient appears medically stable for discharge today.   Updated CSW.  Will not cancel the heads up referral that was made to [facility] on 11/18 until hear from CSW.

## 2015-03-12 NOTE — Progress Notes (Signed)
EMS taking patient now. Daughter to meet them at [facility].

## 2015-03-12 NOTE — Clinical Social Work Placement (Signed)
   CLINICAL SOCIAL WORK PLACEMENT  NOTE  Date:  03/12/2015  Patient Details  Name: Maureen Arnold MRN: [id number] Date of Birth: 1912/10/01  Clinical Social Work is seeking post-discharge placement for this patient at the Skilled  Nursing Facility level of care (*CSW will initial, date and re-position this form in  chart as items are completed):  Yes   Patient/family provided with [facility] Clinical Social Work Department's list of facilities offering this level of care within the geographic area requested by the patient (or if unable, by the patient's family).  Yes   Patient/family informed of their freedom to choose among providers that offer the needed level of care, that participate in Medicare, Medicaid or managed care program needed by the patient, have an available bed and are willing to accept the patient.  Yes   Patient/family informed of [facility] ownership interest in [facility] and [facility], as well as of the fact that they are under no obligation to receive care at these facilities.  PASRR submitted to EDS on 03/12/15     PASRR number received on 03/12/15     Existing PASRR number confirmed on       FL2 transmitted to all facilities in geographic area requested by pt/family on 03/11/15     FL2 transmitted to all facilities within larger geographic area on       Patient informed that his/her managed care company has contracts with or will negotiate with certain facilities, including the following:        Yes   Patient/family informed of bed offers received.  Patient chooses bed at  [facility])     Physician recommends and patient chooses bed at      Patient to be transferred to  [facility]) on 03/12/15.  Patient to be transferred to facility by EMS     Patient family notified on 03/12/15 of transfer.  Name of family member notified:  Daughter Jan     PHYSICIAN Please sign DNR, Please sign FL2, Please prepare priority discharge summary,  including medications     Additional Comment:    _______________________________________________ Ned Cardara N Yevonne Yokum, LCSW 03/12/2015, 10:48 AM

## 2015-03-12 NOTE — Progress Notes (Signed)
CSW was notified that Pt has been medically cleared for dc to SNF. Pt's daughter has selected [facility] and Pt is agreeable. CSW sent dc summary and instructions to SNF. RN to call report and EMS for transfer. Pt's daughter will be at bedside before Pt is transferred.   Wilford Gristara Taariq Leitz, LCSW [phone]

## 2015-03-12 NOTE — Care Management Important Message (Signed)
Important Message  Patient Details  Name: Maureen BenchJanice Tubbs MRN: [id number] Date of Birth: [birth year]   Medicare Important Message Given:  Yes    Olegario MessierKathy A Lillard Bailon 03/12/2015, 10:45 AM

## 2015-03-12 NOTE — Progress Notes (Signed)
Patient being discharged to [facility]. Report called to RN - questions answered, nothing further needed. Packet prepared by CSW. Daughter at bedside, has spoken with CSW, Delice Bisonara. No questions or complaints at this time. Patient going by EMS - IV and telemetry to be removed when they arrive. Gave daughter address so she can meet them there. Vitals stable, patient on room air.

## 2015-03-20 ENCOUNTER — Inpatient Hospital Stay: Payer: Medicare Other | Admitting: Family Medicine

## 2015-05-03 ENCOUNTER — Ambulatory Visit: Payer: Medicare Other | Admitting: Family Medicine

## 2015-05-23 ENCOUNTER — Other Ambulatory Visit: Payer: Self-pay | Admitting: Family Medicine

## 2015-05-23 ENCOUNTER — Encounter: Payer: Self-pay | Admitting: Family Medicine

## 2015-05-23 ENCOUNTER — Ambulatory Visit (INDEPENDENT_AMBULATORY_CARE_PROVIDER_SITE_OTHER): Payer: Medicare Other | Admitting: Family Medicine

## 2015-05-23 VITALS — BP 108/78 | HR 76 | Temp 97.4°F | Resp 16 | Ht 60.0 in | Wt 120.2 lb

## 2015-05-23 DIAGNOSIS — R2681 Unsteadiness on feet: Secondary | ICD-10-CM

## 2015-05-23 DIAGNOSIS — R319 Hematuria, unspecified: Secondary | ICD-10-CM

## 2015-05-23 DIAGNOSIS — H409 Unspecified glaucoma: Secondary | ICD-10-CM | POA: Diagnosis not present

## 2015-05-23 DIAGNOSIS — N390 Urinary tract infection, site not specified: Secondary | ICD-10-CM

## 2015-05-23 DIAGNOSIS — R413 Other amnesia: Secondary | ICD-10-CM | POA: Diagnosis not present

## 2015-05-23 DIAGNOSIS — E559 Vitamin D deficiency, unspecified: Secondary | ICD-10-CM

## 2015-05-23 DIAGNOSIS — L853 Xerosis cutis: Secondary | ICD-10-CM | POA: Diagnosis not present

## 2015-05-23 DIAGNOSIS — R600 Localized edema: Secondary | ICD-10-CM | POA: Diagnosis not present

## 2015-05-23 DIAGNOSIS — I482 Chronic atrial fibrillation, unspecified: Secondary | ICD-10-CM

## 2015-05-23 LAB — POCT URINALYSIS DIPSTICK
Bilirubin, UA: NEGATIVE
Glucose, UA: NEGATIVE
Ketones, UA: NEGATIVE
NITRITE UA: POSITIVE
SPEC GRAV UA: 1.02
UROBILINOGEN UA: 0.2
pH, UA: 6

## 2015-05-23 MED ORDER — POLYETHYLENE GLYCOL 3350 17 G PO PACK: 17.0000 g | PACK | Freq: Every day | ORAL | Status: DC | PRN

## 2015-05-23 MED ORDER — CIPROFLOXACIN HCL 250 MG PO TABS: 250.0000 mg | ORAL_TABLET | Freq: Two times a day (BID) | ORAL | Status: DC

## 2015-05-23 MED ORDER — DOCUSATE SODIUM 100 MG PO CAPS: 100.0000 mg | ORAL_CAPSULE | Freq: Two times a day (BID) | ORAL | Status: DC | PRN

## 2015-05-23 NOTE — Progress Notes (Signed)
Date:  05/23/2015   Name:  Maureen Arnold   DOB:  16-Jul-1912   MRN:  [id number]  PCP:  Schuyler Amor, MD    Chief Complaint: Hematuria   History of Present Illness:  This is a 82 y.o. female hospitalized in November for afib with RVR and UTI, in rehab for 10 days before returning home. Was discharged on Calan-SR 240 mg 1/2 tablet bid but daughter has decreased to 1/2 tablet daily. Taking asa and vit D daily, using GI meds prn. Daughter reports 2 day hx increased confusion and decreased appetite similar to previous UTI. Pt WC bound and daughter having difficulty bathing her 3 times weekly, asks about hospice referral. Trying to void her q2h but wonders about cranberry supp or routine abx. Skin dry and scratches at night. BLE edema about the same.  Review of Systems:  Review of Systems  Constitutional: Negative for fever.  Respiratory: Negative for shortness of breath.   Cardiovascular: Negative for chest pain.  Genitourinary: Negative for dysuria.    Patient Active Problem List   Diagnosis Date Noted  . Recurrent UTI 03/08/2015  . Chronic atrial fibrillation (HCC) 11/24/2014  . Gait instability 11/24/2014  . Short-term memory loss 11/24/2014  . Bilateral lower extremity edema 11/24/2014  . Glaucoma 11/24/2014  . Macular degeneration 11/24/2014  . Vitamin D deficiency 11/24/2014    Prior to Admission medications   Medication Sig Start Date End Date Taking? Authorizing Provider  alum & mag hydroxide-simeth (MAALOX/MYLANTA) 200-200-20 MG/5ML suspension Take 30 mLs by mouth every 6 (six) hours as needed for indigestion, heartburn or flatulence. 03/12/15   Auburn Bilberry, MD  aspirin 81 MG tablet Take 1 tablet (81 mg total) by mouth daily. Patient taking differently: Take 81 mg by mouth at bedtime.  11/24/14   Schuyler Amor, MD  Cholecalciferol (VITAMIN D3) 1000 UNITS CAPS Take 1 capsule (1,000 Units total) by mouth daily. Patient taking differently: Take 1 capsule by mouth at bedtime.   01/31/15   Schuyler Amor, MD  ciprofloxacin (CIPRO) 250 MG tablet Take 1 tablet (250 mg total) by mouth 2 (two) times daily. 05/23/15   Schuyler Amor, MD  docusate sodium (COLACE) 100 MG capsule Take 1 capsule (100 mg total) by mouth 2 (two) times daily. 03/09/15   Katharina Caper, MD  latanoprost (XALATAN) 0.005 % ophthalmic solution Place 1 drop into the left eye at bedtime.    Historical Provider, MD  polyethylene glycol (MIRALAX / GLYCOLAX) packet Take 17 g by mouth daily. 03/12/15   Auburn Bilberry, MD  verapamil (CALAN-SR) 240 MG CR tablet Take 0.5 tablets (120 mg total) by mouth 2 (two) times daily. 03/09/15   Katharina Caper, MD    Allergies  Allergen Reactions  . Ibuprofen Other (See Comments)    bleeding    Past Surgical History  Procedure Laterality Date  . Uterine fibroid surgery      Social History  Substance Use Topics  . Smoking status: Former Smoker    Quit date: 11/20/1958  . Smokeless tobacco: None  . Alcohol Use: 0.6 oz/week    1 Glasses of wine, 0 Standard drinks or equivalent per week     Comment: "every now and then"    Family History  Problem Relation Age of Onset  . Diabetes Neg Hx     Medication list has been reviewed and updated.  Physical Examination: BP 108/78 mmHg  Pulse 76  Temp(Src) 97.4 F (36.3 C)  Resp 16  Ht 5' (1.524 m)  Wt 120 lb 3.2 oz (54.522 kg)  BMI 23.47 kg/m2  SpO2 98%  Physical Exam  Constitutional: She appears well-developed and well-nourished.  Cardiovascular: Normal rate and normal heart sounds.   Irregular rhythm  Pulmonary/Chest: Effort normal and breath sounds normal.  Musculoskeletal:  1+ BLE edema  Neurological: She is alert.  Skin: Skin is warm and dry.  Psychiatric: She has a normal mood and affect. Her behavior is normal.  Nursing note and vitals reviewed.   Assessment and Plan:  1. Urinary tract infection with hematuria, site unspecified UA shows lg leuk and pos nitrite, Cipro x 5d, may start cranberry  extract daily after that - POCT urinalysis dipstick - Ambulatory referral to Hospice  2. Chronic atrial fibrillation (HCC) Well controlled on verapamil, cont asa  3. Gait instability WC bound, daughter struggling with care  4. Vitamin D deficiency Cont vit D supplementation  5. Bilateral lower extremity edema Stable off Lasix  6. Short-term memory loss Likely dementia  7. Glaucoma Cont Xalatan gtts  8. Dry skin Lubricating lotion bid and after shower  Return if symptoms worsen or fail to improve.  Dionne Ano. Kingsley Spittle MD [facility]  05/23/2015

## 2015-05-28 ENCOUNTER — Other Ambulatory Visit: Payer: Self-pay | Admitting: Family Medicine

## 2015-05-28 MED ORDER — VERAPAMIL HCL ER 240 MG PO TBCR: 120.0000 mg | EXTENDED_RELEASE_TABLET | Freq: Every day | ORAL | Status: DC

## 2015-07-17 ENCOUNTER — Inpatient Hospital Stay
Admission: EM | Admit: 2015-07-17 | Discharge: 2015-07-21 | DRG: 183 | Disposition: A | Discharge status: Skilled Nursing Facility | Payer: Medicare Other | Attending: Internal Medicine | Admitting: Internal Medicine

## 2015-07-17 ENCOUNTER — Encounter: Payer: Self-pay | Admitting: Emergency Medicine

## 2015-07-17 ENCOUNTER — Emergency Department: Payer: Medicare Other

## 2015-07-17 ENCOUNTER — Observation Stay: Payer: Medicare Other

## 2015-07-17 DIAGNOSIS — W1811XA Fall from or off toilet without subsequent striking against object, initial encounter: Secondary | ICD-10-CM | POA: Diagnosis not present

## 2015-07-17 DIAGNOSIS — I10 Essential (primary) hypertension: Secondary | ICD-10-CM | POA: Diagnosis not present

## 2015-07-17 DIAGNOSIS — R269 Unspecified abnormalities of gait and mobility: Secondary | ICD-10-CM | POA: Diagnosis present

## 2015-07-17 DIAGNOSIS — Z66 Do not resuscitate: Secondary | ICD-10-CM | POA: Diagnosis not present

## 2015-07-17 DIAGNOSIS — Z23 Encounter for immunization: Secondary | ICD-10-CM

## 2015-07-17 DIAGNOSIS — Z87891 Personal history of nicotine dependence: Secondary | ICD-10-CM

## 2015-07-17 DIAGNOSIS — F0390 Unspecified dementia without behavioral disturbance: Secondary | ICD-10-CM | POA: Diagnosis present

## 2015-07-17 DIAGNOSIS — M7989 Other specified soft tissue disorders: Secondary | ICD-10-CM

## 2015-07-17 DIAGNOSIS — Z79899 Other long term (current) drug therapy: Secondary | ICD-10-CM

## 2015-07-17 DIAGNOSIS — N390 Urinary tract infection, site not specified: Secondary | ICD-10-CM | POA: Diagnosis present

## 2015-07-17 DIAGNOSIS — S2242XA Multiple fractures of ribs, left side, initial encounter for closed fracture: Secondary | ICD-10-CM | POA: Diagnosis not present

## 2015-07-17 DIAGNOSIS — Z886 Allergy status to analgesic agent status: Secondary | ICD-10-CM

## 2015-07-17 DIAGNOSIS — Y92002 Bathroom of unspecified non-institutional (private) residence single-family (private) house as the place of occurrence of the external cause: Secondary | ICD-10-CM | POA: Diagnosis not present

## 2015-07-17 DIAGNOSIS — J189 Pneumonia, unspecified organism: Secondary | ICD-10-CM | POA: Diagnosis not present

## 2015-07-17 DIAGNOSIS — H409 Unspecified glaucoma: Secondary | ICD-10-CM | POA: Diagnosis present

## 2015-07-17 DIAGNOSIS — S42192A Fracture of other part of scapula, left shoulder, initial encounter for closed fracture: Secondary | ICD-10-CM | POA: Diagnosis present

## 2015-07-17 DIAGNOSIS — J9600 Acute respiratory failure, unspecified whether with hypoxia or hypercapnia: Secondary | ICD-10-CM | POA: Diagnosis present

## 2015-07-17 DIAGNOSIS — R531 Weakness: Secondary | ICD-10-CM | POA: Diagnosis not present

## 2015-07-17 DIAGNOSIS — H3530 Unspecified macular degeneration: Secondary | ICD-10-CM | POA: Diagnosis present

## 2015-07-17 DIAGNOSIS — R000 Tachycardia, unspecified: Secondary | ICD-10-CM | POA: Diagnosis present

## 2015-07-17 DIAGNOSIS — M79605 Pain in left leg: Secondary | ICD-10-CM

## 2015-07-17 DIAGNOSIS — J9601 Acute respiratory failure with hypoxia: Secondary | ICD-10-CM | POA: Diagnosis not present

## 2015-07-17 DIAGNOSIS — W19XXXA Unspecified fall, initial encounter: Secondary | ICD-10-CM

## 2015-07-17 DIAGNOSIS — M25512 Pain in left shoulder: Secondary | ICD-10-CM | POA: Diagnosis present

## 2015-07-17 DIAGNOSIS — I482 Chronic atrial fibrillation: Secondary | ICD-10-CM | POA: Diagnosis present

## 2015-07-17 DIAGNOSIS — R0781 Pleurodynia: Secondary | ICD-10-CM | POA: Diagnosis present

## 2015-07-17 DIAGNOSIS — R0902 Hypoxemia: Secondary | ICD-10-CM

## 2015-07-17 DIAGNOSIS — S42102A Fracture of unspecified part of scapula, left shoulder, initial encounter for closed fracture: Secondary | ICD-10-CM

## 2015-07-17 LAB — URINALYSIS COMPLETE WITH MICROSCOPIC (ARMC ONLY)
BILIRUBIN URINE: NEGATIVE
Glucose, UA: NEGATIVE mg/dL
HGB URINE DIPSTICK: NEGATIVE
KETONES UR: NEGATIVE mg/dL
NITRITE: NEGATIVE
PH: 5 (ref 5.0–8.0)
Protein, ur: NEGATIVE mg/dL
Specific Gravity, Urine: 1.015 (ref 1.005–1.030)

## 2015-07-17 LAB — CBC
HCT: 42 % (ref 35.0–47.0)
HEMOGLOBIN: 14.2 g/dL (ref 12.0–16.0)
MCH: 31.2 pg (ref 26.0–34.0)
MCHC: 33.8 g/dL (ref 32.0–36.0)
MCV: 92.4 fL (ref 80.0–100.0)
Platelets: 188 10*3/uL (ref 150–440)
RBC: 4.55 MIL/uL (ref 3.80–5.20)
RDW: 13.2 % (ref 11.5–14.5)
WBC: 11.1 10*3/uL — ABNORMAL HIGH (ref 3.6–11.0)

## 2015-07-17 LAB — BASIC METABOLIC PANEL
ANION GAP: 4 — AB (ref 5–15)
BUN: 10 mg/dL (ref 6–20)
CHLORIDE: 109 mmol/L (ref 101–111)
CO2: 26 mmol/L (ref 22–32)
Calcium: 9 mg/dL (ref 8.9–10.3)
Creatinine, Ser: 0.61 mg/dL (ref 0.44–1.00)
GFR calc Af Amer: >60 mL/min (ref >60)
GFR calc non Af Amer: >60 mL/min (ref >60)
GLUCOSE: 107 mg/dL — AB (ref 65–99)
POTASSIUM: 3.9 mmol/L (ref 3.5–5.1)
SODIUM: 139 mmol/L (ref 135–145)

## 2015-07-17 MED ORDER — ONDANSETRON HCL 4 MG/2ML IJ SOLN
4.0000 mg | Freq: Four times a day (QID) | INTRAMUSCULAR | Status: DC | PRN
  Administered 2015-07-17: 4 mg via INTRAVENOUS
  Filled 2015-07-17: qty 2

## 2015-07-17 MED ORDER — ENOXAPARIN SODIUM 40 MG/0.4ML ~~LOC~~ SOLN
40.0000 mg | SUBCUTANEOUS | Status: DC
  Administered 2015-07-17: 40 mg via SUBCUTANEOUS
  Filled 2015-07-17: qty 0.4

## 2015-07-17 MED ORDER — VERAPAMIL HCL ER 120 MG PO TBCR
120.0000 mg | EXTENDED_RELEASE_TABLET | Freq: Every day | ORAL | Status: DC
  Administered 2015-07-17: 120 mg via ORAL
  Filled 2015-07-17 (×2): qty 1

## 2015-07-17 MED ORDER — ONDANSETRON HCL 4 MG PO TABS: 4.0000 mg | ORAL_TABLET | Freq: Four times a day (QID) | ORAL | Status: DC | PRN

## 2015-07-17 MED ORDER — TETANUS-DIPHTH-ACELL PERTUSSIS 5-2.5-18.5 LF-MCG/0.5 IM SUSP
0.5000 mL | Freq: Once | INTRAMUSCULAR | Status: AC
  Administered 2015-07-17: 0.5 mL via INTRAMUSCULAR
  Filled 2015-07-17: qty 0.5

## 2015-07-17 MED ORDER — BISACODYL 5 MG PO TBEC
5.0000 mg | DELAYED_RELEASE_TABLET | Freq: Every day | ORAL | Status: DC | PRN
  Administered 2015-07-20: 5 mg via ORAL
  Filled 2015-07-17: qty 1

## 2015-07-17 MED ORDER — DOCUSATE SODIUM 100 MG PO CAPS
100.0000 mg | ORAL_CAPSULE | Freq: Two times a day (BID) | ORAL | Status: DC
  Administered 2015-07-17 – 2015-07-21 (×8): 100 mg via ORAL
  Filled 2015-07-17 (×8): qty 1

## 2015-07-17 MED ORDER — ACETAMINOPHEN 650 MG RE SUPP: 650.0000 mg | Freq: Four times a day (QID) | RECTAL | Status: DC | PRN

## 2015-07-17 MED ORDER — VITAMIN D 1000 UNITS PO TABS
1000.0000 [IU] | ORAL_TABLET | Freq: Every day | ORAL | Status: DC
  Administered 2015-07-17 – 2015-07-20 (×4): 1000 [IU] via ORAL
  Filled 2015-07-17 (×4): qty 1

## 2015-07-17 MED ORDER — ACETAMINOPHEN 325 MG PO TABS
650.0000 mg | ORAL_TABLET | Freq: Four times a day (QID) | ORAL | Status: DC | PRN
  Administered 2015-07-20 (×2): 650 mg via ORAL
  Filled 2015-07-17 (×2): qty 2

## 2015-07-17 MED ORDER — FENTANYL CITRATE (PF) 100 MCG/2ML IJ SOLN
12.5000 ug | Freq: Once | INTRAMUSCULAR | Status: AC
  Administered 2015-07-17: 12.5 ug via INTRAVENOUS
  Filled 2015-07-17: qty 2

## 2015-07-17 MED ORDER — HYDROCODONE-ACETAMINOPHEN 5-325 MG PO TABS
1.0000 | ORAL_TABLET | ORAL | Status: DC | PRN
  Administered 2015-07-17 – 2015-07-20 (×5): 1 via ORAL
  Filled 2015-07-17 (×6): qty 1

## 2015-07-17 MED ORDER — ASPIRIN EC 81 MG PO TBEC
81.0000 mg | DELAYED_RELEASE_TABLET | Freq: Every day | ORAL | Status: DC
  Administered 2015-07-17 – 2015-07-20 (×4): 81 mg via ORAL
  Filled 2015-07-17 (×4): qty 1

## 2015-07-17 MED ORDER — ACETAMINOPHEN 160 MG/5ML PO SOLN
500.0000 mg | Freq: Once | ORAL | Status: AC
  Administered 2015-07-17: 500 mg via ORAL
  Filled 2015-07-17: qty 20

## 2015-07-17 MED ORDER — LATANOPROST 0.005 % OP SOLN
1.0000 [drp] | Freq: Every day | OPHTHALMIC | Status: DC
  Administered 2015-07-17 – 2015-07-20 (×4): 1 [drp] via OPHTHALMIC
  Filled 2015-07-17: qty 2.5

## 2015-07-17 NOTE — Plan of Care (Signed)
Please assist family in providing safest care possible for mom.  Maureen NixonJanice [facility](POA) is the only caregiver and truly feels it may be more than she can handle anymore.  Please assist with getting HH at the very least, if not possible rehab. Thank you.

## 2015-07-17 NOTE — ED Provider Notes (Signed)
[facility] Emergency Department Provider Note  ____________________________________________  Time seen: Approximately 10:45 AM  I have reviewed the triage vital signs and the nursing notes.   HISTORY  Chief Complaint Fall and Shoulder Pain  The patient has dementia and does not remember the fall; the history is obtained from the patient's daughter with whom she lives who is the primary caretaker.  HPI Maureen Arnold is a 82 y.o. female with a history of A. fib on aspirin, hypertension, and gait instability presenting with unwitnessed fall. The patient lives with her daughter who reports that when she went to check on her mother this morning, the patient was lying on the floor, alert, but unsure what had happened. She was complaining of left shoulder pain. The daughter reports that the toilet seat had become loosened from the toilet. The patient is unable to give me any information and does not know whether she had any loss of consciousness, or she had any symptoms prior to the fall. It is unclear whether this was a mechanical fall or syncopal event. Over the last several days, there have been no changes in the patient's general health.   Past Medical History  Diagnosis Date  . Hypertension   . Atrial fibrillation (HCC)   . Edema extremities     Patient Active Problem List   Diagnosis Date Noted  . Recurrent UTI 03/08/2015  . Chronic atrial fibrillation (HCC) 11/24/2014  . Gait instability 11/24/2014  . Short-term memory loss 11/24/2014  . Bilateral lower extremity edema 11/24/2014  . Glaucoma 11/24/2014  . Macular degeneration 11/24/2014  . Vitamin D deficiency 11/24/2014    Past Surgical History  Procedure Laterality Date  . Uterine fibroid surgery      Current Outpatient Rx  Name  Route  Sig  Dispense  Refill  . alum & mag hydroxide-simeth (MAALOX/MYLANTA) 200-200-20 MG/5ML suspension   Oral   Take 30 mLs by mouth every 6 (six) hours as  needed for indigestion, heartburn or flatulence.   355 mL   0   . aspirin 81 MG tablet   Oral   Take 1 tablet (81 mg total) by mouth daily. Patient taking differently: Take 81 mg by mouth at bedtime.    30 tablet      . Cholecalciferol (VITAMIN D3) 1000 UNITS CAPS   Oral   Take 1 capsule (1,000 Units total) by mouth daily. Patient taking differently: Take 1 capsule by mouth at bedtime.    30 capsule      . docusate sodium (COLACE) 100 MG capsule   Oral   Take 1 capsule (100 mg total) by mouth 2 (two) times daily as needed for mild constipation.   10 capsule   0   . latanoprost (XALATAN) 0.005 % ophthalmic solution   Left Eye   Place 1 drop into the left eye at bedtime.         . polyethylene glycol (MIRALAX / GLYCOLAX) packet   Oral   Take 17 g by mouth daily as needed.   14 each   0   . verapamil (CALAN-SR) 240 MG CR tablet   Oral   Take 0.5 tablets (120 mg total) by mouth daily.   45 tablet   3     Allergies Ibuprofen  Family History  Problem Relation Age of Onset  . Diabetes Neg Hx     Social History Social History  Substance Use Topics  . Smoking status: Former Engineer, civil (consulting)moker    Quit  date: 11/20/1958  . Smokeless tobacco: None  . Alcohol Use: 0.6 oz/week    1 Glasses of wine, 0 Standard drinks or equivalent per week     Comment: "every now and then"    Review of Systems Constitutional: No fever/chills. Eyes: No visual changes. ENT: No sore throat. No congestion or rhinorrhea. Cardiovascular: Denies chest pain. Denies palpitations. Respiratory: Denies shortness of breath.  No cough. Gastrointestinal: No abdominal pain.  No nausea, no vomiting.  No diarrhea.  No constipation. Genitourinary: Negative for dysuria. Musculoskeletal: Negative for back pain.Positive left shoulder pain. Skin: Negative for rash. Neurological: Negative for headaches. No focal numbness, tingling or weakness. Positive dementia.  10-point ROS otherwise  negative.  ____________________________________________   PHYSICAL EXAM:  VITAL SIGNS: ED Triage Vitals  Enc Vitals Group     BP 07/17/15 0920 164/99 mmHg     Pulse Rate 07/17/15 0920 116     Resp 07/17/15 0920 20     Temp 07/17/15 0920 97.9 F (36.6 C)     Temp Source 07/17/15 0920 Oral     SpO2 07/17/15 0920 100 %     Weight 07/17/15 0920 130 lb (58.968 kg)     Height 07/17/15 0920 5\' 2"  (1.575 m)     Head Cir --      Peak Flow --      Pain Score 07/17/15 0921 6     Pain Loc --      Pain Edu? --      Excl. in GC? --     Constitutional:Patient is alert to person only but answering questions. GCS 15  Eyes: Conjunctivae are normal.  EOMI. No scleral icterus. No raccoon eyes. PERRLA. Head: Atraumatic. No raccoon eyes or Battle sign. No evidence of trauma to the scalp. Nose: No congestion/rhinnorhea. Mouth/Throat: Mucous membranes are moist. No dental injury or malocclusion. Neck: No stridor.  Supple.  No midline C-spine tenderness to palpation, step-offs or deformities. Cardiovascular: Normal rate on my exam, irregular rhythm. No murmurs, rubs or gallops.  Respiratory: Normal respiratory effort.  No accessory muscle use or retractions. Lungs CTAB.  No wheezes, rales or ronchi. Gastrointestinal: Soft, nontender and nondistended.  No guarding or rebound.  No peritoneal signs. Musculoskeletal: Mild diffuse and symmetric lower extremity edema. Full range of motion of the bilateral ankles knees and hips, wrists and elbows, right shoulder without pain. Patient does not have asymmetry of the left shoulder nor any tenderness to palpation along the humerus. However, she does have significant pain with range of motion of the left shoulder. Normal radial pulses bilaterally. Normal grip strength bilaterally. Normal sensation to light touch bilaterally. Neurologic:  Alert.  Speech is clear.  Face and smile are symmetric.  EOMI.  moves uninjured extremities well. Skin:  Skin is warm, dry. 2  skin tears on the left anterior tibia. The first one is linear and approximately 1.5 cm. The second one is 3 cm in moon shape. The skin tears are not deep and there is no underlying visible bone. Psychiatric: Mood and affect are normal. Speech and behavior are normal.  Normal judgement.  ____________________________________________   LABS (all labs ordered are listed, but only abnormal results are displayed)  Labs Reviewed  CBC  BASIC METABOLIC PANEL   ____________________________________________  EKG  ED ECG REPORT I, Rockne Menghini, the attending physician, personally viewed and interpreted this ECG.   Date: 07/17/2015  EKG Time: 934  Rate: 123  Rhythm: atrial fibrillation with rapid ventricular response  Axis: Normal  Intervals:none  ST&T Change: No ST changes. No ST elevation.  ____________________________________________  RADIOLOGY  Dg Shoulder Left  07/17/2015  CLINICAL DATA:  Left shoulder pain from fall while on toilet this morning. Hematoma posterior left scapular region. EXAM: LEFT SHOULDER - 2+ VIEW COMPARISON:  None. FINDINGS: There is mild-to-moderate degenerative change of the glenohumeral joint and mild degenerative change of the [facility] joint. Several small loose bodies over the inferior glenohumeral joint. There are displaced acute fractures of the left posterior fifth, sixth and seventh ribs. No evidence of pneumothorax. Focal irregularity along the lateral aspect of the mid to lower scapula suggesting a fracture. IMPRESSION: Possible fracture along the lateral aspect of the mid to lower left scapula. Acute displaced fractures of the left posterior fifth, sixth and seventh ribs. No pneumothorax. Degenerative changes of the left AC joint and glenohumeral joints. Loose bodies over the inferior left glenohumeral joint. Electronically Signed   By: Elberta Fortis M.D.   On: 07/17/2015 10:41     ____________________________________________   PROCEDURES  Procedure(s) performed: None  Critical Care performed: No ____________________________________________   INITIAL IMPRESSION / ASSESSMENT AND PLAN / ED COURSE  Pertinent labs & imaging results that were available during my care of the patient were reviewed by me and considered in my medical decision making (see chart for details).  82 y.o. female status post unwitnessed fall presenting with left shoulder pain. We will evaluate for injury to the left humerus although she does not appear to have dislocation. For the patient's skin tears, we will clean the wounds copiously, and then apply Steri-Strips to allow the torn skin to act as a natural Band-Aid to prevent infection and pain.  I've had a long discussion with the patient's primary caregiver, her daughter, who is also her POA. At this time, she does not wish to have any further evaluation for syncope, nor any other intracranial or internal injuries. She understands that there is risk of intracranial bleed, stroke, causes of syncope, but reports that it would be the wish of her mother not to have any additional workup. We will treat the patient's pain which is the primary concern, and evaluate her for trauma. Her tetanus booster will be updated.  ----------------------------------------- 10:56 AM on 07/17/2015 -----------------------------------------  The patient's x-ray does show a scapular fracture as well as 3 posterior rib fractures.  ----------------------------------------- 11:17 AM on 07/17/2015 -----------------------------------------  I have discussed the patient's fractures with Dr. Martha Clan, who recommends immobilizer versus sling. I have ordered a sling as it is likely that the patient's discomfort will be greater with the immobilizer. The patient does have 3 posterior rib fractures and although she does not have new Morris thorax, at her age, there is  significant risk for development of pneumonia from splinting. At this time, her pain is not well controlled. She is cared for by an elderly daughter and has pretty significant issues with being mobile independently at home, and that is without the fractures. I'm concerned that if she went home she would have severe pain and be of very high fall risk. My plan is to admit the patient to the hospital for pain control, incentive spirometry, and for her to be discharged once she is able to control the pain well and be safe.  ____________________________________________  FINAL CLINICAL IMPRESSION(S) / ED DIAGNOSES  Final diagnoses:  Fall, initial encounter  Scapula fracture, left, closed, initial encounter  Ribs, multiple fractures, left, closed, initial encounter      NEW MEDICATIONS STARTED DURING  THIS VISIT:  New Prescriptions   No medications on file     Rockne Menghini, MD 07/17/15 1119

## 2015-07-17 NOTE — ED Notes (Signed)
Pts daughter requesting information about admission due to insurance not covering admission. EDP made aware and case management called

## 2015-07-17 NOTE — ED Notes (Signed)
Pt placed on 2L oxygen 

## 2015-07-17 NOTE — ED Notes (Signed)
Attempted to call report, Nurse will call back.

## 2015-07-17 NOTE — Care Management Note (Signed)
Case Management Note  Patient Details  Name: Sammie BenchJanice Weiher MRN: [id number] Date of Birth: June 27, 1912  Subjective/Objective:    Daughter very upset that pt. Is only observation.She is overwhelmed caring for the pt. And now she has this issue with pain. Given brochures for [facility] and Home Care providers so she is able to research what she may be able to do.Dr Luberta MutterKonidena poresent for talk.                Action/Plan:   Expected Discharge Date:                  Expected Discharge Plan:     In-House Referral:     Discharge planning Services     Post Acute Care Choice:    Choice offered to:     DME Arranged:    DME Agency:     HH Arranged:    HH Agency:     Status of Service:     Medicare Important Message Given:    Date Medicare IM Given:    Medicare IM give by:    Date Additional Medicare IM Given:    Additional Medicare Important Message give by:     If discussed at Long Length of Stay Meetings, dates discussed:    Additional Comments:  Berna BueCheryl Shailey Butterbaugh, RN 07/17/2015, 12:54 PM

## 2015-07-17 NOTE — ED Notes (Signed)
Pt from home to ed via ems with reports of left shoulder pain from falling while up to the bathroom this am. Ems states fell on the toilet. Pt with skin tears to left lower leg, dressing in place by ems.  22 gauge IV in right hand. Ems gave iv Fentenyl in route to ed. Ems reports vss. Pt with hx of AFIB and HTN.  CBG 85. Pt alert and in no acute distress on arrival to ed. Left shoulder immobilized by ems.

## 2015-07-17 NOTE — ED Notes (Signed)
Skin tear cleaned and Steri strips applied to left lower leg along with bulk dressing. Cms intact.

## 2015-07-17 NOTE — H&P (Signed)
[facility] [place] at [facility]   PATIENT NAME: Maureen Arnold    MR#:  [id number]  DATE OF BIRTH:  1912-08-17  DATE OF ADMISSION:  07/17/2015  PRIMARY CARE PHYSICIAN: Schuyler AmorWilliam Plonk, MD   REQUESTING/REFERRING PHYSICIAN: Dr. Sharma Covertnorman  CHIEF COMPLAINT:   Chief Complaint  Patient presents with  . Fall  . Shoulder Pain    HISTORY OF PRESENT ILLNESS:  Maureen Arnold  is a [age] y.o. female with a known history of essential hypertension proximal atrial fibrillation who lives with the daughter  had a fall today morning. Patient fell in the bathroom. Patient did not have dizziness. Patient just lost balance and fell on the bathroom floor. Noted to have right scapular fracture and also to fractures in the right side. Being admitted for pain management,, and watch for pneumonia. Patient's daughter is  very anxious about overnight stay and requesting if the Medicare  will pay her bills  PAST MEDICAL HISTORY:   Past Medical History  Diagnosis Date  . Hypertension   . Atrial fibrillation (HCC)   . Edema extremities     PAST SURGICAL HISTOIRY:   Past Surgical History  Procedure Laterality Date  . Uterine fibroid surgery      SOCIAL HISTORY:   Social History  Substance Use Topics  . Smoking status: Former Smoker    Quit date: 11/20/1958  . Smokeless tobacco: Not on file  . Alcohol Use: 0.6 oz/week    1 Glasses of wine, 0 Standard drinks or equivalent per week     Comment: "every now and then"    FAMILY HISTORY:   Family History  Problem Relation Age of Onset  . Diabetes Neg Hx     DRUG ALLERGIES:   Allergies  Allergen Reactions  . Ibuprofen Other (See Comments)    bleeding    REVIEW OF SYSTEMS: You, very hard of hearing but able to onset questions appropriately.   CONSTITUTIONAL: No fever, fatigue or weakness.  EYES: No blurred or double vision.  EARS, NOSE, AND THROAT: No tinnitus or ear pain.  RESPIRATORY: No cough, shortness of  breath, wheezing or hemoptysis.  CARDIOVASCULAR: No chest pain, orthopnea, edema.  GASTROINTESTINAL: No nausea, vomiting, diarrhea or abdominal pain.  GENITOURINARY: No dysuria, hematuria.  ENDOCRINE: No polyuria, nocturia,  HEMATOLOGY: No anemia, easy bruising or bleeding SKIN: No rash or lesion. MUSCULOSKELETAL: Left shoulder pain and chest wall pain on the left side, has a low excellent edema present NEUROLOGIC: No tingling, numbness, weakness.  PSYCHIATRY: No anxiety or depression.   MEDICATIONS AT HOME:   Prior to Admission medications   Medication Sig Start Date End Date Taking? Authorizing Provider  aspirin 81 MG tablet Take 1 tablet (81 mg total) by mouth daily. Patient taking differently: Take 81 mg by mouth at bedtime.  11/24/14  Yes Schuyler AmorWilliam Plonk, MD  Cholecalciferol (VITAMIN D3) 1000 UNITS CAPS Take 1 capsule (1,000 Units total) by mouth daily. Patient taking differently: Take 1 capsule by mouth at bedtime.  01/31/15  Yes Schuyler AmorWilliam Plonk, MD  CRANBERRY PO Take 2 tablets by mouth daily.   Yes Historical Provider, MD  latanoprost (XALATAN) 0.005 % ophthalmic solution Place 1 drop into the left eye at bedtime.   Yes Historical Provider, MD  verapamil (CALAN-SR) 240 MG CR tablet Take 0.5 tablets (120 mg total) by mouth daily. 05/28/15  Yes Schuyler AmorWilliam Plonk, MD      VITAL SIGNS:  Blood pressure 164/99, pulse 117, temperature 97.9 F (36.6 C), temperature  source Oral, resp. rate 22, height  (1.575 m), weight 58.968 kg (130 lb), SpO2 96 %.  PHYSICAL EXAMINATION:  GENERAL:  82 y.o.-year-old patient lying in the bed with no acute distress.  EYES: Pupils equal, round, reactive to light and accommodation. No scleral icterus. Extraocular muscles intact.  HEENT: Head atraumatic, normocephalic. Oropharynx and nasopharynx clear.  NECK:  Supple, no jugular venous distention. No thyroid enlargement, no tenderness.  LUNGS: Normal breath sounds bilaterally, no wheezing, rales,rhonchi or  crepitation. No use of accessory muscles of respiration.  CARDIOVASCULAR: S1, S2 normal. No murmurs, rubs, or gallops.  ABDOMEN: Soft, nontender, nondistended. Bowel sounds present. No organomegaly or mass.  EXTREMITIES; lower extremity edema present normal Range of motion on the right shoulder without pain , pain on the left shoulder with range of motion. NEUROLOGIC: Cranial nerves II through XII are intact. Muscle strength 5/5 in all extremities. Sensation intact. Gait not checked.  PSYCHIATRIC: The patient is alert and oriented x 3.  SKIN: No obvious rash, lesion, or ulcer.   LABORATORY PANEL:   CBC  Recent Labs Lab 07/17/15 1120  WBC 11.1*  HGB 14.2  HCT 42.0  PLT 188   ------------------------------------------------------------------------------------------------------------------  Chemistries   Recent Labs Lab 07/17/15 1120  NA 139  K 3.9  CL 109  CO2 26  GLUCOSE 107*  BUN 10  CREATININE 0.61  CALCIUM 9.0   ------------------------------------------------------------------------------------------------------------------  Cardiac Enzymes No results for input(s): TROPONINI in the last 168 hours. ------------------------------------------------------------------------------------------------------------------  RADIOLOGY:  Dg Shoulder Left  07/17/2015  CLINICAL DATA:  Left shoulder pain from fall while on toilet this morning. Hematoma posterior left scapular region. EXAM: LEFT SHOULDER - 2+ VIEW COMPARISON:  None. FINDINGS: There is mild-to-moderate degenerative change of the glenohumeral joint and mild degenerative change of the [facility] joint. Several small loose bodies over the inferior glenohumeral joint. There are displaced acute fractures of the left posterior fifth, sixth and seventh ribs. No evidence of pneumothorax. Focal irregularity along the lateral aspect of the mid to lower scapula suggesting a fracture. IMPRESSION: Possible fracture along the lateral aspect of  the mid to lower left scapula. Acute displaced fractures of the left posterior fifth, sixth and seventh ribs. No pneumothorax. Degenerative changes of the left AC joint and glenohumeral joints. Loose bodies over the inferior left glenohumeral joint. Electronically Signed   By: Elberta Fortis M.D.   On: 07/17/2015 10:41    EKG:   Orders placed or performed during the hospital encounter of 07/17/15  . EKG 12-Lead  . EKG 12-Lead  . EKG 12-Lead  . EKG 12-Lead   atrial fibrillation with 123 beats per minute  IMPRESSION AND PLAN:   #1 left scapula fracture, rib fractures on the left side: Due to fall; Admit observation status for pain management, watch for risk of developing pneumonia, physical therapy consult. Order incentive spirometry.  Immobilizer left shoulder with sling. Continue pain management. #2 hypertension, accelerated fibrillation: Continue her home medications. #3 initially patient's daughter did not even  want labs and UA also,   All the records are reviewed and case discussed with ED provider. Management plans discussed with the patient, family and they are in agreement.  CODE STATUS: full  TOTAL TIME TAKING CARE OF THIS PATIENT: .    Katha Hamming M.D on 07/17/2015 at 12:54 PM  Between 7am to 6pm - Pager - [phone]  After 6pm go to [email or web address] - password [email or web address] [facility]  [facility]  Office  [phone]  CC: Primary care physician;  Schuyler Amor, MD  Note: This dictation was prepared with Dragon dictation along with smaller phrase technology. Any transcriptional errors that result from this process are unintentional.

## 2015-07-17 NOTE — ED Notes (Signed)
MD at bedside. 

## 2015-07-17 NOTE — ED Notes (Signed)
Xray completed. Family at bedside.

## 2015-07-17 NOTE — ED Notes (Signed)
Daughter voicing concerns about pt's pain/agitation. See MAR. Pt adjusted in bed and changed

## 2015-07-17 NOTE — Care Management Obs Status (Signed)
MEDICARE OBSERVATION STATUS NOTIFICATION   Patient Details  Name: Maureen BenchJanice Champine MRN: [id number] Date of Birth: [birth year]   Medicare Observation Status Notification Given:  Yes    Berna BueCheryl Kala Ambriz, RN 07/17/2015, 12:56 PM

## 2015-07-18 ENCOUNTER — Inpatient Hospital Stay: Payer: Medicare Other

## 2015-07-18 DIAGNOSIS — R531 Weakness: Secondary | ICD-10-CM | POA: Diagnosis present

## 2015-07-18 DIAGNOSIS — Z886 Allergy status to analgesic agent status: Secondary | ICD-10-CM | POA: Diagnosis not present

## 2015-07-18 DIAGNOSIS — W1811XA Fall from or off toilet without subsequent striking against object, initial encounter: Secondary | ICD-10-CM | POA: Diagnosis present

## 2015-07-18 DIAGNOSIS — F0390 Unspecified dementia without behavioral disturbance: Secondary | ICD-10-CM | POA: Diagnosis present

## 2015-07-18 DIAGNOSIS — R269 Unspecified abnormalities of gait and mobility: Secondary | ICD-10-CM | POA: Diagnosis present

## 2015-07-18 DIAGNOSIS — Y92002 Bathroom of unspecified non-institutional (private) residence single-family (private) house as the place of occurrence of the external cause: Secondary | ICD-10-CM | POA: Diagnosis not present

## 2015-07-18 DIAGNOSIS — J9600 Acute respiratory failure, unspecified whether with hypoxia or hypercapnia: Secondary | ICD-10-CM | POA: Diagnosis present

## 2015-07-18 DIAGNOSIS — S42192A Fracture of other part of scapula, left shoulder, initial encounter for closed fracture: Secondary | ICD-10-CM | POA: Diagnosis present

## 2015-07-18 DIAGNOSIS — H3530 Unspecified macular degeneration: Secondary | ICD-10-CM | POA: Diagnosis present

## 2015-07-18 DIAGNOSIS — Z87891 Personal history of nicotine dependence: Secondary | ICD-10-CM | POA: Diagnosis not present

## 2015-07-18 DIAGNOSIS — H409 Unspecified glaucoma: Secondary | ICD-10-CM | POA: Diagnosis present

## 2015-07-18 DIAGNOSIS — Z23 Encounter for immunization: Secondary | ICD-10-CM | POA: Diagnosis not present

## 2015-07-18 DIAGNOSIS — J9601 Acute respiratory failure with hypoxia: Secondary | ICD-10-CM | POA: Diagnosis present

## 2015-07-18 DIAGNOSIS — Z66 Do not resuscitate: Secondary | ICD-10-CM | POA: Diagnosis present

## 2015-07-18 DIAGNOSIS — J189 Pneumonia, unspecified organism: Secondary | ICD-10-CM | POA: Diagnosis present

## 2015-07-18 DIAGNOSIS — R000 Tachycardia, unspecified: Secondary | ICD-10-CM | POA: Diagnosis present

## 2015-07-18 DIAGNOSIS — I482 Chronic atrial fibrillation: Secondary | ICD-10-CM | POA: Diagnosis present

## 2015-07-18 DIAGNOSIS — N390 Urinary tract infection, site not specified: Secondary | ICD-10-CM | POA: Diagnosis present

## 2015-07-18 DIAGNOSIS — M25512 Pain in left shoulder: Secondary | ICD-10-CM | POA: Diagnosis present

## 2015-07-18 DIAGNOSIS — Z79899 Other long term (current) drug therapy: Secondary | ICD-10-CM | POA: Diagnosis not present

## 2015-07-18 DIAGNOSIS — I10 Essential (primary) hypertension: Secondary | ICD-10-CM | POA: Diagnosis present

## 2015-07-18 DIAGNOSIS — S2242XA Multiple fractures of ribs, left side, initial encounter for closed fracture: Secondary | ICD-10-CM | POA: Diagnosis present

## 2015-07-18 LAB — BASIC METABOLIC PANEL
ANION GAP: 4 — AB (ref 5–15)
BUN: 12 mg/dL (ref 6–20)
CHLORIDE: 107 mmol/L (ref 101–111)
CO2: 25 mmol/L (ref 22–32)
Calcium: 8.7 mg/dL — ABNORMAL LOW (ref 8.9–10.3)
Creatinine, Ser: 0.65 mg/dL (ref 0.44–1.00)
GFR calc non Af Amer: >60 mL/min (ref >60)
GLUCOSE: 126 mg/dL — AB (ref 65–99)
Potassium: 3.7 mmol/L (ref 3.5–5.1)
Sodium: 136 mmol/L (ref 135–145)

## 2015-07-18 LAB — CBC
HCT: 37 % (ref 35.0–47.0)
HEMOGLOBIN: 12.5 g/dL (ref 12.0–16.0)
MCH: 32 pg (ref 26.0–34.0)
MCHC: 33.7 g/dL (ref 32.0–36.0)
MCV: 95 fL (ref 80.0–100.0)
Platelets: 163 10*3/uL (ref 150–440)
RBC: 3.9 MIL/uL (ref 3.80–5.20)
RDW: 13.8 % (ref 11.5–14.5)
WBC: 7.7 10*3/uL (ref 3.6–11.0)

## 2015-07-18 LAB — GLUCOSE, CAPILLARY: Glucose-Capillary: 108 mg/dL — ABNORMAL HIGH (ref 65–99)

## 2015-07-18 MED ORDER — IPRATROPIUM-ALBUTEROL 0.5-2.5 (3) MG/3ML IN SOLN: 3.0000 mL | RESPIRATORY_TRACT | Status: DC | PRN

## 2015-07-18 MED ORDER — SODIUM CHLORIDE 0.9 % IV BOLUS (SEPSIS)
250.0000 mL | Freq: Once | INTRAVENOUS | Status: AC
  Administered 2015-07-18: 250 mL via INTRAVENOUS

## 2015-07-18 MED ORDER — ENOXAPARIN SODIUM 30 MG/0.3ML ~~LOC~~ SOLN
30.0000 mg | SUBCUTANEOUS | Status: DC
  Administered 2015-07-18 – 2015-07-20 (×3): 30 mg via SUBCUTANEOUS
  Filled 2015-07-18 (×4): qty 0.3

## 2015-07-18 MED ORDER — VERAPAMIL HCL ER 120 MG PO TBCR
120.0000 mg | EXTENDED_RELEASE_TABLET | Freq: Every day | ORAL | Status: DC
  Administered 2015-07-18 – 2015-07-21 (×4): 120 mg via ORAL
  Filled 2015-07-18 (×4): qty 1

## 2015-07-18 MED ORDER — DEXTROSE 5 % IV SOLN
1.0000 g | INTRAVENOUS | Status: DC
  Administered 2015-07-18 – 2015-07-20 (×3): 1 g via INTRAVENOUS
  Filled 2015-07-18 (×4): qty 10

## 2015-07-18 MED ORDER — MORPHINE SULFATE (PF) 2 MG/ML IV SOLN: 2.0000 mg | INTRAVENOUS | Status: DC | PRN

## 2015-07-18 NOTE — Clinical Social Work Placement (Signed)
   CLINICAL SOCIAL WORK PLACEMENT  NOTE  Date:  07/18/2015  Patient Details  Name: Maureen Arnold MRN: [id number] Date of Birth: [birth year]  Clinical Social Work is seeking post-discharge placement for this patient at the Skilled  Nursing Facility level of care (*CSW will initial, date and re-position this form in  chart as items are completed):  Yes   Patient/family provided with [facility] Clinical Social Work Department's list of facilities offering this level of care within the geographic area requested by the patient (or if unable, by the patient's family).  Yes   Patient/family informed of their freedom to choose among providers that offer the needed level of care, that participate in Medicare, Medicaid or managed care program needed by the patient, have an available bed and are willing to accept the patient.  Yes   Patient/family informed of [facility]'s ownership interest in [facility] and [facility], as well as of the fact that they are under no obligation to receive care at these facilities.  PASRR submitted to EDS on       PASRR number received on       Existing PASRR number confirmed on 07/18/15     FL2 transmitted to all facilities in geographic area requested by pt/family on 07/18/15     FL2 transmitted to all facilities within larger geographic area on       Patient informed that his/her managed care company has contracts with or will negotiate with certain facilities, including the following:        Yes   Patient/family informed of bed offers received.  Patient chooses bed at  [facility]     Physician recommends and patient chooses bed at      Patient to be transferred to   on  .  Patient to be transferred to facility by       Patient family notified on   of transfer.  Name of family member notified:        PHYSICIAN       Additional Comment:    _______________________________________________ Haig ProphetMorgan, [name] G, LCSW 07/18/2015, 4:33  PM

## 2015-07-18 NOTE — Progress Notes (Signed)
Patient changed from observation to inpatient. Patient's daughter Liborio NixonJanice was notified and daughter requested for patient to go to [facility]. CSW sent out bed search and presented offers to daughter. Daughter chose [facility]. Bill admissions coordinator at [facility] is aware of accepted bed offer. CSW explained to daughter that she will have to meet medical necessity to [facility] starting tonight. Patient can D/C Saturday 07/21/15 if medically stable. CSW will continue to follow and assist as needed.   Jetta LoutBailey [name], LCSW [phone]

## 2015-07-18 NOTE — NC FL2 (Signed)
[place] MEDICAID FL2 LEVEL OF CARE SCREENING TOOL     IDENTIFICATION  Patient Name: Maureen Arnold Birthdate: 19-May-1912 Sex: female Admission Date (Current Location): 07/17/2015  [place] and [place] Number:  Chiropodist and Address:  [facility], [address], [place], [place] [address]      Provider Number: [id number]  Attending Physician Name and Address:  Milagros Loll, MD  Relative Name and Phone Number:       Current Level of Care: Hospital Recommended Level of Care: Skilled Nursing Facility Prior Approval Number:    Date Approved/Denied:   PASRR Number:  [id number])  Discharge Plan: SNF    Current Diagnoses: Patient Active Problem List   Diagnosis Date Noted  . Acute respiratory failure (HCC) 07/18/2015  . Rib pain on left side 07/17/2015  . Recurrent UTI 03/08/2015  . Chronic atrial fibrillation (HCC) 11/24/2014  . Gait instability 11/24/2014  . Short-term memory loss 11/24/2014  . Bilateral lower extremity edema 11/24/2014  . Glaucoma 11/24/2014  . Macular degeneration 11/24/2014  . Vitamin D deficiency 11/24/2014    Orientation RESPIRATION BLADDER Height & Weight     Self  Normal Incontinent Weight: 126 lb (57.153 kg) Height:   (157.5 cm)  BEHAVIORAL SYMPTOMS/MOOD NEUROLOGICAL BOWEL NUTRITION STATUS   (None)  (None) Incontinent Diet (Heart)  AMBULATORY STATUS COMMUNICATION OF NEEDS Skin   Extensive Assist Verbally Other (Comment) (Left Leg skin tear X2 & Other left hip)                       Personal Care Assistance Level of Assistance  Bathing, Feeding, Dressing Bathing Assistance: Limited assistance Feeding assistance: Limited assistance Dressing Assistance: Limited assistance     Functional Limitations Info  Sight, Hearing, Speech Sight Info: Adequate Hearing Info: Impaired Speech Info: Adequate    SPECIAL CARE FACTORS FREQUENCY  PT (By licensed PT)     PT Frequency:  (5)              Contractures      Additional Factors Info  Code Status, Allergies Code Status Info:  (DNR) Allergies Info:  (Ibuprofen )           Current Medications (07/18/2015):  This is the current hospital active medication list Current Facility-Administered Medications  Medication Dose Route Frequency Provider Last Rate Last Dose  . acetaminophen (TYLENOL) tablet 650 mg  650 mg Oral Q6H PRN Katha Hamming, MD       Or  . acetaminophen (TYLENOL) suppository 650 mg  650 mg Rectal Q6H PRN Katha Hamming, MD      . aspirin EC tablet 81 mg  81 mg Oral QHS Katha Hamming, MD   81 mg at 07/17/15 2236  . bisacodyl (DULCOLAX) EC tablet 5 mg  5 mg Oral Daily PRN Katha Hamming, MD      . cholecalciferol (VITAMIN D) tablet 1,000 Units  1,000 Units Oral QHS Katha Hamming, MD   1,000 Units at 07/17/15 2237  . docusate sodium (COLACE) capsule 100 mg  100 mg Oral BID Katha Hamming, MD   100 mg at 07/18/15 3086  . enoxaparin (LOVENOX) injection 30 mg  30 mg Subcutaneous Q24H Milagros Loll, MD   30 mg at 07/18/15 1258  . HYDROcodone-acetaminophen (NORCO/VICODIN) 5-325 MG per tablet 1-2 tablet  1-2 tablet Oral Q4H PRN Katha Hamming, MD   1 tablet at 07/18/15 0457  . ipratropium-albuterol (DUONEB) 0.5-2.5 (3) MG/3ML nebulizer solution 3 mL  3  mL Nebulization Q4H PRN Srikar Sudini, MD      . latanoprost (XALATAN) 0.005 % ophthalmic solution 1 drop  1 drop Left Eye QHS Katha HammingSnehalatha Konidena, MD   1 drop at 07/17/15 2235  . morphine 2 MG/ML injection 2 mg  2 mg Intravenous Q4H PRN Srikar Sudini, MD      . ondansetron (ZOFRAN) tablet 4 mg  4 mg Oral Q6H PRN Katha HammingSnehalatha Konidena, MD       Or  . ondansetron (ZOFRAN) injection 4 mg  4 mg Intravenous Q6H PRN Katha HammingSnehalatha Konidena, MD   4 mg at 07/17/15 1349  . verapamil (CALAN-SR) CR tablet 120 mg  120 mg Oral Daily Milagros LollSrikar Sudini, MD   120 mg at 07/18/15 [id number]     Discharge Medications: Please see discharge summary for a list of  discharge medications.  Relevant Imaging Results:  Relevant Lab Results:   Additional Information  (SSN [id number])  Verta Ellenhristina E Kaylon Hitz, LCSW

## 2015-07-18 NOTE — Clinical Social Work Note (Signed)
Clinical Social Work Assessment  Patient Details  Name: Maureen Arnold MRN: [id number] Date of Birth: [birth year]  Date of referral:  07/18/15               Reason for consult:  Facility Placement (Patient is [place] Observation)                Permission sought to share information with:    Permission granted to share information::     Name::        Agency::     Relationship::     Contact Information:     Housing/Transportation Living arrangements for the past 2 months:  Single Family Home Source of Information:  Power of [place], Adult Children Patient Interpreter Needed:  None Criminal Activity/Legal Involvement Pertinent to Current Situation/Hospitalization:  No - Comment as needed Significant Relationships:  Adult Children, Other Family Members Lives with:  Adult Children Do you feel safe going back to the place where you live?    Need for family participation in patient care:  Yes (Comment)  Care giving concerns:  Patient lives in [place] with her daughter Maureen Arnold.    Social Worker assessment / plan:  Holiday representative (CSW) received SNF consult. Patient is currently under Medicare Observation status. Per PT it took 2 people to get patient to sit on the side of the bed. PT reported that patient could not get out of the bed. CSW met with patient at bedside. Patient did not speak and did not answer questions. CSW contacted patient's daughter Maureen Arnold to complete assessment. Per daughter she is in her 71's and has lived with patient for the past 20 years. Per daughter patient has 2 sons that don't assist daughter in caring for patient. Per daughter patient was ambulating with a walker at home. CSW explained that patient is under Medicare observation, which means that Medicare will not pay for SNF. CSW discussed private pay option at SNF with daughter. Daughter became upset and tearful. Daughter reported that patient cannot pay privately for SNF and daughter has  ran out of money caring for patient. Daughter reported that she has used her 401K to pay for patient's care and now it is gone. Daughter asked if patient's heart rate will change her to inpatient. CSW explained that Medicare guidelines determine observation status and we will continue to check to see if anything will change her to inpatient. CSW provided emotional support. CSW discussed case with assistant social work Database administrator.   Employment status:  Disabled (Comment on whether or not currently receiving Disability), Retired Forensic scientist:  Medicare (Medicare Observation ) PT Recommendations:  Frank / Referral to community resources:  Other (Comment Required) (Home Health VS. SNF )  Patient/Family's Response to care:  Patient's daughter is upset about observation status and reported that she cannot take patient home in this state. Daughter wants patient to go to SNF. CSW continued to explain that patient is under observation and Medicare will not pay for SNF. CSW offered daughter SNF placement for patient under private pay. Daughter reported that she cannot pay privately for SNF.    Patient/Family's Understanding of and Emotional Response to Diagnosis, Current Treatment, and Prognosis:  Daughter was upset and tearful.   Emotional Assessment Appearance:  Appears stated age Attitude/Demeanor/Rapport:  Unable to Assess Affect (typically observed):  Unable to Assess Orientation:  Oriented to Self, Fluctuating Orientation (Suspected and/or reported Sundowners) Alcohol / Substance use:  Not Applicable Psych involvement (Current  and /or in the community):  No (Comment)  Discharge Needs  Concerns to be addressed:  Discharge Planning Concerns Readmission within the last 30 days:  No Current discharge risk:  Chronically ill, Cognitively Impaired, Dependent with Mobility Barriers to Discharge:  Continued Medical Work up   Elwyn Reach 07/18/2015, 12:23 PM

## 2015-07-18 NOTE — Care Management Note (Signed)
Case Management Note  Patient Details  Name: Maureen Arnold MRN: [id number] Date of Birth: 1912-10-26  Subjective/Objective:                  Met with patient and she did not speak. Per the nurse patient had talked to her but she is confused. I contacted patient's daughter Maureen Arnold [phone] and spoke with her at length. She states her mother has "wanted to die for a long time" and "to please understand that we need to just let her go if she does". Patient is a DNR. Patient is having HR ~130's. Per Maureen Arnold patient has history of a-fib which patient has had a long time but she does not know normal HR.Maureen Arnold states that she was told that "she has to have her mother out of the hospital in 24 hours". She states that she "has been treated cold and that she is 82 years old and was considering suicidal". I asked Maureen Arnold if she was suicidal now and she said "no" and "she promised". She said she does not have any help and "no life". She understands Medicare Observation which was explained again to her. Patient ambulates with a rollator at home but also has a wheelchair. Maureen Arnold mentioned getting a hospital bed if she has to go home. She would like for her to go to SNF. Patient does not have Medicaid. She makes $1,000/month per Maureen Arnold. Her PCP is Dr. Vicente Masson. She is not on O2 now and not on O2 at home. Patient has had home health in the past but does not remember name of agency.   Action/Plan: List of home health agencies left at patient's bedside. RNCM will continue to folllow.   Expected Discharge Date:                  Expected Discharge Plan:     In-House Referral:     Discharge planning Services  CM Consult  Post Acute Care Choice:  Home Health Choice offered to:  Adult Children  DME Arranged:    DME Agency:     HH Arranged:    HH Agency:     Status of Service:  In process, will continue to follow  Medicare Important Message Given:    Date Medicare IM Given:    Medicare IM give by:    Date  Additional Medicare IM Given:    Additional Medicare Important Message give by:     If discussed at [place] of Stay Meetings, dates discussed:    Additional Comments:  Maureen Garfinkel, RN 07/18/2015, 9:18 AM

## 2015-07-18 NOTE — Care Management (Signed)
Notified by RN that patient's daughter is here now visiting with patient and she wants to speak with CM and SW. I have no updates and have asked Dr. Elpidio AnisSudini to visit with her- he agreed in 20 minutes. I have spoke with daughter and told her too.

## 2015-07-18 NOTE — Evaluation (Signed)
Physical Therapy Evaluation Patient Details Name: Maureen Arnold MRN: [id number] DOB: Dec 16, 1912 Today's Date: 07/18/2015   History of Present Illness  Patient is a [age] y/o female that presents after falling at home sustaining L scapular fx along with displaced 4,5,6th rib fx on L.   Clinical Impression  Patient is unable to provide much history and no family is present, all comparisons are made to previous PT note at this facility from November 2016. Patient is essentially totally dependent with transfers at this time, unable to provide much if any assistance. She requires near maximal/dependent assistance for sitting balance, unclear if due to weakness, confusion. Patient will  Be placed on PT trial, as she has difficulty following commands/actively participating. She at one point was able to ambulate as she fell to bring on this admission. She may be appropriate for rehab if she can participate more actively, if not she would need 24 hour assistance and likely require lift at this point. PT will follow.     Follow Up Recommendations SNF    Equipment Recommendations       Recommendations for Other Services       Precautions / Restrictions Precautions Precautions: Shoulder Shoulder Interventions: Shoulder sling/immobilizer Precaution Comments: Sling should be on at all times  Restrictions Weight Bearing Restrictions: Yes LUE Weight Bearing: Non weight bearing      Mobility  Bed Mobility Overal bed mobility: +2 for physical assistance;Needs Assistance Bed Mobility: Sit to Supine;Supine to Sit     Supine to sit: Total assist;+2 for physical assistance Sit to supine: Total assist;+2 for physical assistance   General bed mobility comments: PT and aide delicately transferred patient, making sure to avoid hands on L side due to fractures. Patient unable to provide much if any assistance.   Transfers                    Ambulation/Gait                Stairs             Wheelchair Mobility    Modified Rankin (Stroke Patients Only)       Balance Overall balance assessment: Needs assistance   Sitting balance-Leahy Scale: Zero Sitting balance - Comments: Patient leans heavily to her R side and posteriorly. Requires heavy assistance to maintain any semblance of sitting balance.  Postural control: Right lateral lean;Posterior lean                                   Pertinent Vitals/Pain Pain Assessment: Faces Faces Pain Scale: Hurts whole lot Pain Location: L arm? Pain Descriptors / Indicators: Aching;Guarding;Moaning Pain Intervention(s): Limited activity within patient's tolerance;Monitored during session    Home Living Family/patient expects to be discharged to:: Private residence Living Arrangements: Children Available Help at Discharge: Family;Available PRN/intermittently Type of Home: House Home Access: Level entry     Home Layout: One level Home Equipment: Walker - 4 wheels;Transport chair      Prior Function Level of Independence: Needs assistance   Gait / Transfers Assistance Needed: Patient able to perform transfers and short ambulatory distances with rollator and supervision from daughter.  ADL's / Homemaking Assistance Needed: Performed by daughter  Comments: No family available, comments and listing above copied from previous PT evaluation in November, per notes it appears she had been at roughly the same level of mobility.      Hand  Dominance        Extremity/Trunk Assessment   Upper Extremity Assessment: Difficult to assess due to impaired cognition           Lower Extremity Assessment: Difficult to assess due to impaired cognition      Cervical / Trunk Assessment: Kyphotic  Communication   Communication: No difficulties  Cognition Arousal/Alertness: Awake/alert Behavior During Therapy: Flat affect Overall Cognitive Status: History of cognitive impairments - at baseline (She does  not respond much to PT, she appears to have moderate to significant age related deficits, though unclear if this is her baseline)                      General Comments General comments (skin integrity, edema, etc.): Abrasions on LLE, symmetetrical bilateral darkening of skin at roughly malleoli.    Exercises        Assessment/Plan    PT Assessment Patient needs continued PT services  PT Diagnosis Difficulty walking;Generalized weakness;Acute pain   PT Problem List Decreased strength;Decreased knowledge of use of DME;Decreased safety awareness;Decreased knowledge of precautions;Decreased activity tolerance;Decreased balance;Decreased mobility;Decreased cognition  PT Treatment Interventions Gait training;DME instruction;Patient/family education;Therapeutic activities;Therapeutic exercise   PT Goals (Current goals can be found in the Care Plan section) Acute Rehab PT Goals PT Goal Formulation: Patient unable to participate in goal setting Time For Goal Achievement: 08/01/15 Potential to Achieve Goals: Poor    Frequency 7X/week   Barriers to discharge Decreased caregiver support      Co-evaluation               End of Session   Activity Tolerance: Patient limited by pain Patient left: in bed;with call bell/phone within reach;with bed alarm set      Functional Assessment Tool Used: Clinical judgement  Functional Limitation: Mobility: Walking and moving around Mobility: Walking and Moving Around Current Status (J1914): 100 percent impaired, limited or restricted Mobility: Walking and Moving Around Goal Status (N8295): At least 80 percent but less than 100 percent impaired, limited or restricted    Time: 0930-0945 PT Time Calculation (min) (ACUTE ONLY): 15 min   Charges:   PT Evaluation $PT Eval Moderate Complexity: 1 Procedure     PT G Codes:   PT G-Codes **NOT FOR INPATIENT CLASS** Functional Assessment Tool Used: Clinical judgement  Functional  Limitation: Mobility: Walking and moving around Mobility: Walking and Moving Around Current Status (A2130): 100 percent impaired, limited or restricted Mobility: Walking and Moving Around Goal Status (Q6578): At least 80 percent but less than 100 percent impaired, limited or restricted   Kerin Ransom, PT, DPT    07/18/2015, 1:07 PM

## 2015-07-18 NOTE — Progress Notes (Signed)
Pharmacy Note - Anticoagulation  Patient with orders for enoxaparin 40mg  SQ Q24H for VTE prophylaxis  Estimated Creatinine Clearance: 29.6 mL/min (by C-G formula based on Cr of 0.65)..  Will adjust to enoxaparin 30mg  SQ Q24H for CrCl < 5530ml/min  Garlon HatchetJody Traeger Sultana, PharmD Clinical Pharmacist  07/18/2015 9:26 AM

## 2015-07-18 NOTE — Progress Notes (Signed)
[facility] [place] at [facility]   PATIENT NAME: Maureen Arnold    MR#:  [id number]  DATE OF BIRTH:  12-Jan-1913  SUBJECTIVE:  CHIEF COMPLAINT:   Chief Complaint  Patient presents with  . Fall  . Shoulder Pain   Admitted for fall with pain and left shoulder and chest. Confused at baseline dementia.  REVIEW OF SYSTEMS:    Review of Systems  Unable to perform ROS: dementia    DRUG ALLERGIES:   Allergies  Allergen Reactions  . Ibuprofen Other (See Comments)    bleeding    VITALS:  Blood pressure 120/54, pulse 94, temperature 98.7 F (37.1 C), temperature source Oral, resp. rate 18, height  (1.575 m), weight 57.153 kg (126 lb), SpO2 94 %.  PHYSICAL EXAMINATION:   Physical Exam  GENERAL:  82 y.o.-year-old patient lying in the bed with no acute distress.  EYES: Pupils equal, round, reactive to light and accommodation. No scleral icterus. Extraocular muscles intact.  HEENT: Head atraumatic, normocephalic. Oropharynx and nasopharynx clear.  NECK:  Supple, no jugular venous distention. No thyroid enlargement, no tenderness.  LUNGS: Normal breath sounds bilaterally, no wheezing, rales, rhonchi. No use of accessory muscles of respiration. Decreased breath sounds on the left CARDIOVASCULAR: S1, S2 normal. No murmurs, rubs, or gallops.  ABDOMEN: Soft, nontender, nondistended. Bowel sounds present. No organomegaly or mass.  EXTREMITIES: No cyanosis, clubbing or edema b/l.   Left arm sling NEUROLOGIC: Cranial nerves II through XII are intact. No focal Motor or sensory deficits b/l.   PSYCHIATRIC: The patient is alert and awake. Pleasantly confused. SKIN: Skin tears and lower extremity's with Steri-Strips   LABORATORY PANEL:   CBC  Recent Labs Lab 07/18/15 0700  WBC 7.7  HGB 12.5  HCT 37.0  PLT 163   ------------------------------------------------------------------------------------------------------------------ Chemistries   Recent  Labs Lab 07/18/15 0700  NA 136  K 3.7  CL 107  CO2 25  GLUCOSE 126*  BUN 12  CREATININE 0.65  CALCIUM 8.7*   ------------------------------------------------------------------------------------------------------------------  Cardiac Enzymes No results for input(s): TROPONINI in the last 168 hours. ------------------------------------------------------------------------------------------------------------------  RADIOLOGY:  Dg Shoulder Left  07/17/2015  CLINICAL DATA:  Left shoulder pain from fall while on toilet this morning. Hematoma posterior left scapular region. EXAM: LEFT SHOULDER - 2+ VIEW COMPARISON:  None. FINDINGS: There is mild-to-moderate degenerative change of the glenohumeral joint and mild degenerative change of the [facility] joint. Several small loose bodies over the inferior glenohumeral joint. There are displaced acute fractures of the left posterior fifth, sixth and seventh ribs. No evidence of pneumothorax. Focal irregularity along the lateral aspect of the mid to lower scapula suggesting a fracture. IMPRESSION: Possible fracture along the lateral aspect of the mid to lower left scapula. Acute displaced fractures of the left posterior fifth, sixth and seventh ribs. No pneumothorax. Degenerative changes of the left AC joint and glenohumeral joints. Loose bodies over the inferior left glenohumeral joint. Electronically Signed   By: Elberta Fortis M.D.   On: 07/17/2015 10:41   Dg Hip Unilat With Pelvis 2-3 Views Left  07/17/2015  CLINICAL DATA:  One 82 year old female with history of pain and swelling of the left lower extremity the region of the lateral hip. EXAM: DG HIP (WITH OR WITHOUT PELVIS) 2-3V LEFT COMPARISON:  No priors. FINDINGS: AP view of the pelvis demonstrates no acute displaced pelvic ring fractures. Bilateral proximal femurs as visualized appear intact, and the left femoral head is properly located. There is joint  space narrowing, subchondral sclerosis, subchondral  cyst formation and osteophyte formation in the hip joints bilaterally, compatible with moderate osteoarthritis. Atherosclerosis. IMPRESSION: 1. No acute abnormality of the bony pelvis or the left hip. 2. Moderate bilateral hip joint osteoarthritis. Electronically Signed   By: Trudie Reedaniel  Entrikin M.D.   On: 07/17/2015 19:04     ASSESSMENT AND PLAN:   * Acute hypoxic respiratory failure Etiology unclear. Could be atelectasis from rib fractures or early pneumonia. Check chest x-ray DuoNeb when necessary Incentive spirometer  * UTI Treat with IV ceftriaxone. Check urine cultures. She was tachycardic and had mildly elevated WBC on admission.  * Left scapular and rib fractures Pain medications as needed. Oral and IV.  * Atrial fibrillation with rapid ventricular rate 100 consistently in the 130s. Was given normal saline bolus of 250 earlier today and heart rate improved to 92. Continue verapamil  * Hypertension Continue verapamil  * Weakness Physical therapy recommending skilled nursing facility and discussed case with social work and case manager  * DVT prophylaxis with Lovenox  All the records are reviewed and case discussed with Care Management/Social Workerr. Management plans discussed with the patient, family and they are in agreement.  Discussed in detail with patient's daughter at bedside.  CODE STATUS: DNR  DVT Prophylaxis: SCDs  TOTAL TIME TAKING CARE OF THIS PATIENT: 35 minutes.   POSSIBLE D/C IN 2-3 DAYS, DEPENDING ON CLINICAL CONDITION.  Milagros LollSudini, Phoua Hoadley R M.D on 07/18/2015 at 3:09 PM  Between 7am to 6pm - Pager - [phone]  After 6pm go to [email or web address] - password [email or web address]  Fabio Neighb[facility]  Office  [phone]  CC: Primary care physician; Schuyler AmorWilliam Plonk, MD  Note: This dictation was prepared with Dragon dictation along with smaller phrase technology. Any transcriptional errors that result from this process are unintentional.

## 2015-07-19 LAB — GLUCOSE, CAPILLARY: Glucose-Capillary: 90 mg/dL (ref 65–99)

## 2015-07-19 MED ORDER — METOPROLOL TARTRATE 50 MG PO TABS
50.0000 mg | ORAL_TABLET | Freq: Two times a day (BID) | ORAL | Status: DC
  Administered 2015-07-19 – 2015-07-21 (×4): 50 mg via ORAL
  Filled 2015-07-19 (×5): qty 1

## 2015-07-19 NOTE — Progress Notes (Signed)
Physical Therapy Treatment Patient Details Name: Maureen Arnold MRN: [id number] DOB: 09/04/1912 Today's Date: 07/19/2015    History of Present Illness Patient is a 82 y/o female that presents after falling at home sustaining L scapular fx along with displaced 4,5,6th rib fx on L.     PT Comments    Spoke with nursing prior to treatment who notes pt has been able to communicate affectively at times and other times mildly confused. Pt has had difficulty with bed mobility due to pain. Pt lethargic, but responsive. Pt unable to answer how she fell; does not remember. Pt initially refuses PT, but then notes she wants to get up (because she has company coming?) Pt attempts moving toward edge of bed with Bilateral lower extremities, but ultimately requires assist. Pt struggles with right upper extremity to elevate trunk with head of bed partially elevated, but ultimately does so with only Min A at right shoulder to steady self in sitting position. Pt independently scoots to edge of bed with increased time/effort. Pt tolerates sitting for approximately 7 minutes requiring occasional Min A and cueing to use right upper extremity to support self. Pt attempts to remove O2 and gown several times causing a loss in balance. Pt does not wish to perform any exercises or attempt stand. Pt complains of being tired and wishes just to sleep and does so sitting. Assisted pt to supine position to lower trunk and elevate legs. All mobility is painful. Continue PT to progress functional mobility as tolerated.   Follow Up Recommendations  SNF     Equipment Recommendations       Recommendations for Other Services       Precautions / Restrictions Restrictions Weight Bearing Restrictions: Yes LUE Weight Bearing: Non weight bearing    Mobility  Bed Mobility Overal bed mobility: Needs Assistance Bed Mobility: Sit to Supine;Supine to Sit     Supine to sit: Min assist;HOB elevated Sit to supine: Min assist    General bed mobility comments: Increased time/effort to elevate trunk with RUE, but pt does so eventuatll with very Min A to steady initially in static sit. Min A for LEs off edge of bed. Pt scoots independently to edge of bed to reach feet to floor. Pt attempts several times to remove O2 and gown also losing balance to the R.  Transfers                 General transfer comment: Not tested. Pt too fatigued in sit, falling asleep then requesting to lie back down.  Ambulation/Gait                 Stairs            Wheelchair Mobility    Modified Rankin (Stroke Patients Only)       Balance Overall balance assessment: Needs assistance Sitting-balance support: Single extremity supported;Feet supported Sitting balance-Leahy Scale: Fair Sitting balance - Comments: leans to right, but able to maintain with close S and cues when increasing lean to use RUE to support self.                             Cognition Arousal/Alertness: Lethargic Behavior During Therapy: Restless (alternates between aware and mild confused,) Overall Cognitive Status: History of cognitive impairments - at baseline                      Exercises  General Comments General comments (skin integrity, edema, etc.): Abrasions, lacerations LLE and. Bruising BLEs      Pertinent Vitals/Pain Pain Assessment: Faces Faces Pain Scale: Hurts worst (Pt states "I hurt so bad") Pain Location: "all over" Pain Intervention(s): Limited activity within patient's tolerance;Monitored during session    Home Living                      Prior Function            PT Goals (current goals can now be found in the care plan section)      Frequency  7X/week    PT Plan Current plan remains appropriate    Co-evaluation             End of Session Equipment Utilized During Treatment: Oxygen Activity Tolerance: Patient limited by lethargy;Patient limited by pain  (Refused LE bed exercises) Patient left: in bed;with call bell/phone within reach;with bed alarm set     Time: [id number] PT Time Calculation (min) (ACUTE ONLY): 20 min  Charges:  $Therapeutic Activity: 8-22 mins                    G Codes:      Kristeen MissHeidi Elizabeth Bishop, PTA 07/19/2015, 1:40 PM

## 2015-07-19 NOTE — Plan of Care (Signed)
Problem: Activity: Goal: Risk for activity intolerance will decrease Outcome: Not Progressing unsteady gait

## 2015-07-19 NOTE — Progress Notes (Signed)
Plan is for patient to D/C to [facility] Saturday 07/21/15 pending medical clearance. Clinical Social Worker (CSW) met with patient and her granddaughter Junie Panning and made her aware of above. CSW answered Erin's questions about observation VS. Inpatient admission. CSW will continue to follow and assist as needed.   Blima Rich, LCSW [phone]

## 2015-07-19 NOTE — Progress Notes (Signed)
[facility] - [place] at [facility]   PATIENT NAME: Maureen Arnold    MR#:  [id number]  DATE OF BIRTH:  1912/09/15  SUBJECTIVE:  CHIEF COMPLAINT:   Chief Complaint  Patient presents with  . Fall  . Shoulder Pain   Admitted for fall with pain and left shoulder and chest. Confused. Pain left chest and shoulder only on movement.  REVIEW OF SYSTEMS:    Review of Systems  Unable to perform ROS: dementia    DRUG ALLERGIES:   Allergies  Allergen Reactions  . Ibuprofen Other (See Comments)    bleeding    VITALS:  Blood pressure 144/90, pulse 135, temperature 97.9 F (36.6 C), temperature source Oral, resp. rate 18, height  (1.575 m), weight 49.545 kg (109 lb 3.6 oz), SpO2 95 %.  PHYSICAL EXAMINATION:   Physical Exam  GENERAL:  83 y.o.-year-old patient lying in the bed with no acute distress.  EYES: Pupils equal, round, reactive to light and accommodation. No scleral icterus. Extraocular muscles intact.  HEENT: Head atraumatic, normocephalic. Oropharynx and nasopharynx clear.  NECK:  Supple, no jugular venous distention. No thyroid enlargement, no tenderness.  LUNGS: Normal breath sounds bilaterally, no wheezing, rales, rhonchi. No use of accessory muscles of respiration. Decreased breath sounds on the left CARDIOVASCULAR: S1, S2 normal. No murmurs, rubs, or gallops.  ABDOMEN: Soft, nontender, nondistended. Bowel sounds present. No organomegaly or mass.  EXTREMITIES: No cyanosis, clubbing or edema b/l.   Left arm sling NEUROLOGIC: Cranial nerves II through XII are intact. No focal Motor or sensory deficits b/l.   PSYCHIATRIC: The patient is alert and awake. Pleasantly confused. SKIN: Skin tears and lower extremity's with Steri-Strips. Lower extremity bruising  LABORATORY PANEL:   CBC  Recent Labs Lab 07/18/15 0700  WBC 7.7  HGB 12.5  HCT 37.0  PLT 163    ------------------------------------------------------------------------------------------------------------------ Chemistries   Recent Labs Lab 07/18/15 0700  NA 136  K 3.7  CL 107  CO2 25  GLUCOSE 126*  BUN 12  CREATININE 0.65  CALCIUM 8.7*   ------------------------------------------------------------------------------------------------------------------  Cardiac Enzymes No results for input(s): TROPONINI in the last 168 hours. ------------------------------------------------------------------------------------------------------------------  RADIOLOGY:  Dg Chest Port 1 View  07/18/2015  CLINICAL DATA:  Hypoxia, shortness of Breath, left scapular fracture EXAM: PORTABLE CHEST 1 VIEW COMPARISON:  Left shoulder same day FINDINGS: Mild displaced left upper rib fractures are again noted. No pneumothorax. Mild left basilar atelectasis or infiltrate. There is no pneumothorax. Degenerative changes bilateral shoulders. Cardiomegaly. Atherosclerotic calcifications of thoracic aorta. IMPRESSION: No pneumothorax. Mild displaced left upper rib fractures again noted. Mild left basilar atelectasis or infiltrate. Electronically Signed   By: Natasha Mead M.D.   On: 07/18/2015 15:29   Dg Hip Unilat With Pelvis 2-3 Views Left  07/17/2015  CLINICAL DATA:  One 82 year old female with history of pain and swelling of the left lower extremity the region of the lateral hip. EXAM: DG HIP (WITH OR WITHOUT PELVIS) 2-3V LEFT COMPARISON:  No priors. FINDINGS: AP view of the pelvis demonstrates no acute displaced pelvic ring fractures. Bilateral proximal femurs as visualized appear intact, and the left femoral head is properly located. There is joint space narrowing, subchondral sclerosis, subchondral cyst formation and osteophyte formation in the hip joints bilaterally, compatible with moderate osteoarthritis. Atherosclerosis. IMPRESSION: 1. No acute abnormality of the bony pelvis or the left hip. 2. Moderate  bilateral hip joint osteoarthritis. Electronically Signed   By: Trudie Reed M.D.   On:  07/17/2015 19:04     ASSESSMENT AND PLAN:   * Acute hypoxic respiratory failure due to left lower lobe pneumonia contributing by report fractures DuoNeb when necessary Oxygen at 2 L On IV antibiotic  * UTI Treat with IV ceftriaxone. Check urine cultures. She was tachycardic and had mildly elevated WBC on admission.  * Left scapular and rib fractures Pain medications as needed. Oral and IV.  * Atrial fibrillation with rapid ventricular rate Continue verapamil. Add metoprolol.  * Hypertension Continue verapamil Added metoprolol  * Weakness Skilled nursing facility at discharge  * DVT prophylaxis with Lovenox  All the records are reviewed and case discussed with Care Management/Social Workerr. Management plans discussed with the patient, family and they are in agreement.  CODE STATUS: DNR  DVT Prophylaxis: SCDs  TOTAL TIME TAKING CARE OF THIS PATIENT: 35 minutes.   POSSIBLE D/C IN 2-3 DAYS, DEPENDING ON CLINICAL CONDITION.  Milagros LollSudini, Hari Casaus R M.D on 07/19/2015 at 12:31 PM  Between 7am to 6pm - Pager - [phone]  After 6pm go to [email or web address] - password [email or web address]  Fabio Neighborsagle [place] [facility]  Office  [phone]  CC: Primary care physician; Schuyler AmorWilliam Plonk, MD  Note: This dictation was prepared with Dragon dictation along with smaller phrase technology. Any transcriptional errors that result from this process are unintentional.

## 2015-07-20 LAB — URINE CULTURE
Culture: >=100000
Special Requests: NORMAL

## 2015-07-20 LAB — GLUCOSE, CAPILLARY: GLUCOSE-CAPILLARY: 88 mg/dL (ref 65–99)

## 2015-07-20 MED ORDER — CEFPROZIL 250 MG PO TABS: 250.0000 mg | ORAL_TABLET | Freq: Two times a day (BID) | ORAL | Status: AC

## 2015-07-20 MED ORDER — METOPROLOL TARTRATE 50 MG PO TABS: 50.0000 mg | ORAL_TABLET | Freq: Two times a day (BID) | ORAL | Status: AC

## 2015-07-20 MED ORDER — HYDROCODONE-ACETAMINOPHEN 5-325 MG PO TABS: 1.0000 | ORAL_TABLET | ORAL | Status: AC | PRN

## 2015-07-20 MED ORDER — DOCUSATE SODIUM 100 MG PO CAPS: 100.0000 mg | ORAL_CAPSULE | Freq: Every day | ORAL | Status: AC

## 2015-07-20 MED ORDER — VERAPAMIL HCL ER 120 MG PO TBCR: 120.0000 mg | EXTENDED_RELEASE_TABLET | Freq: Every day | ORAL | Status: AC

## 2015-07-20 NOTE — Care Management Important Message (Signed)
Important Message  Patient Details  Name: Maureen Arnold MRN: [id number] Date of Birth: 1912/12/16   Medicare Important Message Given:  Yes    Collie SiadAngela Delos Klich, RN 07/20/2015, 8:22 AM

## 2015-07-20 NOTE — Discharge Summary (Addendum)
[facility] [place] at [facility]   PATIENT NAME: Maureen BenchJanice Mccarver    MR#:  [id number]  DATE OF BIRTH:  Jun 06, 1912  DATE OF ADMISSION:  07/17/2015 ADMITTING PHYSICIAN: Katha HammingSnehalatha Konidena, MD  DATE OF DISCHARGE: 07/21/2015  PRIMARY CARE PHYSICIAN: Schuyler AmorWilliam Plonk, MD   ADMISSION DIAGNOSIS:  Fall, initial encounter [W19.XXXA] Scapula fracture, left, closed, initial encounter [S42.102A] Ribs, multiple fractures, left, closed, initial encounter [S22.42XA]  DISCHARGE DIAGNOSIS:  Active Problems:   Rib pain on left side   Acute respiratory failure (HCC)   SECONDARY DIAGNOSIS:   Past Medical History  Diagnosis Date  . Hypertension   . Atrial fibrillation (HCC)   . Edema extremities      ADMITTING HISTORY  Maureen Arnold is a 82 y.o. female with a known history of essential hypertension proximal atrial fibrillation who lives with the daughter had a fall today morning. Patient fell in the bathroom. Patient did not have dizziness. Patient just lost balance and fell on the bathroom floor. Noted to have right scapular fracture and also to fractures in the right side. Being admitted for pain management,, and watch for pneumonia. Patient's daughter is very anxious about overnight stay and requesting if the Medicare will pay her bills  HOSPITAL COURSE:    * Acute hypoxic respiratory failure - resolved due to left lower lobe pneumonia contributed by report fractures PO Abx after discharge  * UTI - E coli Treat with IV ceftriaxone in hospital Change to cefpodoxime at d/c  * Left scapular and rib fractures Pain medications as needed. Oral  * Atrial fibrillation with rapid ventricular rate - improved Continue verapamil. Added metoprolol.  * Hypertension On verapamil and metoprolol  * Weakness Skilled nursing facility at discharge  * DVT prophylaxis with Lovenox in hsopital  Stable for discharge.  Will need palliative care follow patient at  rehab.  CONSULTS OBTAINED:     DRUG ALLERGIES:   Allergies  Allergen Reactions  . Ibuprofen Other (See Comments)    bleeding    DISCHARGE MEDICATIONS:   Current Discharge Medication List    START taking these medications   Details  cefPROZIL (CEFZIL) 250 MG tablet Take 1 tablet (250 mg total) by mouth 2 (two) times daily. Qty: 10 tablet, Refills: 0    docusate sodium (COLACE) 100 MG capsule Take 1 capsule (100 mg total) by mouth daily.    HYDROcodone-acetaminophen (NORCO/VICODIN) 5-325 MG tablet Take 1 tablet by mouth every 4 (four) hours as needed for moderate pain. Qty: 30 tablet, Refills: 0    metoprolol (LOPRESSOR) 50 MG tablet Take 1 tablet (50 mg total) by mouth 2 (two) times daily.      CONTINUE these medications which have CHANGED   Details  verapamil (CALAN-SR) 120 MG CR tablet Take 1 tablet (120 mg total) by mouth daily.      CONTINUE these medications which have NOT CHANGED   Details  aspirin 81 MG tablet Take 1 tablet (81 mg total) by mouth daily. Qty: 30 tablet    Cholecalciferol (VITAMIN D3) 1000 UNITS CAPS Take 1 capsule (1,000 Units total) by mouth daily. Qty: 30 capsule    CRANBERRY PO Take 2 tablets by mouth daily.    latanoprost (XALATAN) 0.005 % ophthalmic solution Place 1 drop into the left eye at bedtime.   Associated Diagnoses: Glaucoma        Today   VITAL SIGNS:  Blood pressure 149/66, pulse 62, temperature 98.5 F (36.9 C), temperature source Oral, resp. rate  18, height  (1.575 m), weight 49.442 kg (109 lb), SpO2 95 %.  I/O:    Intake/Output Summary (Last 24 hours) at 07/21/15 0851 Last data filed at 07/20/15 1738  Gross per 24 hour  Intake      0 ml  Output      0 ml  Net      0 ml    PHYSICAL EXAMINATION:  Physical Exam  GENERAL:  82-year-old patient lying in the bed with no acute distress.  LUNGS: Normal breath sounds bilaterally, no wheezing, rales,rhonchi or crepitation. No use of accessory muscles of  respiration.  CARDIOVASCULAR: S1, S2 normal. No murmurs, rubs, or gallops.  ABDOMEN: Soft, non-tender NEUROLOGIC: Moves all 4 extremities. PSYCHIATRIC: The patient is confused, pleasant Left arm sling  DATA REVIEW:   CBC  Recent Labs Lab 07/18/15 0700  WBC 7.7  HGB 12.5  HCT 37.0  PLT 163    Chemistries   Recent Labs Lab 07/18/15 0700 07/21/15 0435  NA 136  --   K 3.7  --   CL 107  --   CO2 25  --   GLUCOSE 126*  --   BUN 12  --   CREATININE 0.65 0.53  CALCIUM 8.7*  --     Cardiac Enzymes No results for input(s): TROPONINI in the last 168 hours.  Microbiology Results  Results for orders placed or performed during the hospital encounter of 07/17/15  Urine culture     Status: None   Collection Time: 07/17/15  5:30 PM  Result Value Ref Range Status   Specimen Description URINE, RANDOM  Final   Special Requests Normal  Final   Culture >=100,000 COLONIES/mL ESCHERICHIA COLI  Final   Report Status 07/20/2015 FINAL  Final   Organism ID, Bacteria ESCHERICHIA COLI  Final      Susceptibility   Escherichia coli - MIC*    AMPICILLIN >=32 RESISTANT Resistant     CEFAZOLIN <=4 SENSITIVE Sensitive     CEFTRIAXONE <=1 SENSITIVE Sensitive     CIPROFLOXACIN >=4 RESISTANT Resistant     GENTAMICIN 2 SENSITIVE Sensitive     IMIPENEM <=0.25 SENSITIVE Sensitive     NITROFURANTOIN <=16 SENSITIVE Sensitive     TRIMETH/SULFA <=20 SENSITIVE Sensitive     AMPICILLIN/SULBACTAM 16 INTERMEDIATE Intermediate     PIP/TAZO <=4 SENSITIVE Sensitive     Extended ESBL NEGATIVE Sensitive     * >=100,000 COLONIES/mL ESCHERICHIA COLI    RADIOLOGY:  No results found.  Follow up with PCP in 1 week.  Management plans discussed with the patient, family and they are in agreement.  CODE STATUS:     Code Status Orders        Start     Ordered   07/17/15 1526  Do not attempt resuscitation (DNR)   Continuous    Question Answer Comment  In the event of cardiac or respiratory ARREST Do  not call a "code blue"   In the event of cardiac or respiratory ARREST Do not perform Intubation, CPR, defibrillation or ACLS   In the event of cardiac or respiratory ARREST Use medication by any route, position, wound care, and other measures to relive pain and suffering. May use oxygen, suction and manual treatment of airway obstruction as needed for comfort.   Comments Nurse may pronounce      07/17/15 1526    Code Status History    Date Active Date Inactive Code Status Order ID Comments User Context  07/17/2015 12:53 PM 07/17/2015  3:26 PM Full Code [id number]  Katha Hamming, MD ED   03/11/2015  6:57 PM 03/12/2015  5:08 PM DNR [id number]  Auburn Bilberry, MD Inpatient   03/08/2015 11:51 PM 03/11/2015  6:57 PM Full Code [id number]  Wyatt Haste, MD ED    Advance Directive Documentation        Most Recent Value   Type of Advance Directive  Healthcare Power of [place], Living will Dorrance Carrier/POA and daughter]   Pre-existing out of facility DNR order (yellow form or pink MOST form)     "MOST" Form in Place?        TOTAL TIME TAKING CARE OF THIS PATIENT ON DAY OF DISCHARGE: more than 30 minutes.   Milagros Loll R M.D on 07/20/2015 at 5:17 PM  Between 7am to 6pm - Pager - [phone]  After 6pm go to [email or web address] - password [email or web address]  [facility]  Office  [phone]  CC: Primary care physician; Schuyler Amor, MD  Note: This dictation was prepared with Dragon dictation along with smaller phrase technology. Any transcriptional errors that result from this process are unintentional.

## 2015-07-20 NOTE — Discharge Instructions (Addendum)
°  DIET:  Regular diet  DISCHARGE CONDITION:  Stable  ACTIVITY:  Activity as tolerated  OXYGEN:  Home Oxygen: Yes   Oxygen Delivery: 2 L [place]  DISCHARGE LOCATION:  nursing home   If you experience worsening of your admission symptoms, develop shortness of breath, life threatening emergency, suicidal or homicidal thoughts you must seek medical attention immediately by calling 911 or calling your MD immediately  if symptoms less severe.  You Must read complete instructions/literature along with all the possible adverse reactions/side effects for all the Medicines you take and that have been prescribed to you. Take any new Medicines after you have completely understood and accpet all the possible adverse reactions/side effects.   Please note  You were cared for by a hospitalist during your hospital stay. If you have any questions about your discharge medications or the care you received while you were in the hospital after you are discharged, you can call the unit and asked to speak with the hospitalist on call if the hospitalist that took care of you is not available. Once you are discharged, your primary care physician will handle any further medical issues. Please note that NO REFILLS for any discharge medications will be authorized once you are discharged, as it is imperative that you return to your primary care physician (or establish a relationship with a primary care physician if you do not have one) for your aftercare needs so that they can reassess your need for medications and monitor your lab values.

## 2015-07-20 NOTE — Progress Notes (Signed)
[facility] [place] at [facility]   PATIENT NAME: Maureen BenchJanice Waheed    MR#:  [id number]  DATE OF BIRTH:  Nov 27, 1912  SUBJECTIVE:  CHIEF COMPLAINT:   Chief Complaint  Patient presents with  . Fall  . Shoulder Pain   Admitted for fall with pain and left shoulder and chest. Confused. Has dementia Pain left chest and shoulder only on movement. Poor historian  REVIEW OF SYSTEMS:    Review of Systems  Unable to perform ROS: dementia    DRUG ALLERGIES:   Allergies  Allergen Reactions  . Ibuprofen Other (See Comments)    bleeding    VITALS:  Blood pressure 122/56, pulse 52, temperature 97.6 F (36.4 C), temperature source Oral, resp. rate 18, height 5\' 2"  (1.575 m), weight 54.432 kg (120 lb), SpO2 95 %.  PHYSICAL EXAMINATION:   Physical Exam  GENERAL:  82 y.o.-year-old patient lying in the bed with no acute distress.  EYES: Pupils equal, round, reactive to light and accommodation. No scleral icterus. Extraocular muscles intact.  HEENT: Head atraumatic, normocephalic. Oropharynx and nasopharynx clear.  NECK:  Supple, no jugular venous distention. No thyroid enlargement, no tenderness.  LUNGS: Normal breath sounds bilaterally, no wheezing, rales, rhonchi. No use of accessory muscles of respiration. Decreased breath sounds on the left CARDIOVASCULAR: S1, S2 normal. No murmurs, rubs, or gallops.  ABDOMEN: Soft, nontender, nondistended. Bowel sounds present. No organomegaly or mass.  EXTREMITIES: No cyanosis, clubbing or edema b/l.   Left arm sling NEUROLOGIC: Cranial nerves II through XII are intact. No focal Motor or sensory deficits b/l.   PSYCHIATRIC: The patient is alert and awake. Pleasantly confused. SKIN: Skin tears and lower extremity's with Steri-Strips. Lower extremity bruising  LABORATORY PANEL:   CBC  Recent Labs Lab 07/18/15 0700  WBC 7.7  HGB 12.5  HCT 37.0  PLT 163    ------------------------------------------------------------------------------------------------------------------ Chemistries   Recent Labs Lab 07/18/15 0700  NA 136  K 3.7  CL 107  CO2 25  GLUCOSE 126*  BUN 12  CREATININE 0.65  CALCIUM 8.7*   ------------------------------------------------------------------------------------------------------------------  Cardiac Enzymes No results for input(s): TROPONINI in the last 168 hours. ------------------------------------------------------------------------------------------------------------------  RADIOLOGY:  Dg Chest Port 1 View  07/18/2015  CLINICAL DATA:  Hypoxia, shortness of Breath, left scapular fracture EXAM: PORTABLE CHEST 1 VIEW COMPARISON:  Left shoulder same day FINDINGS: Mild displaced left upper rib fractures are again noted. No pneumothorax. Mild left basilar atelectasis or infiltrate. There is no pneumothorax. Degenerative changes bilateral shoulders. Cardiomegaly. Atherosclerotic calcifications of thoracic aorta. IMPRESSION: No pneumothorax. Mild displaced left upper rib fractures again noted. Mild left basilar atelectasis or infiltrate. Electronically Signed   By: Natasha MeadLiviu  Pop M.D.   On: 07/18/2015 15:29     ASSESSMENT AND PLAN:   * Acute hypoxic respiratory failure due to left lower lobe pneumonia contributing by report fractures DuoNeb when necessary Oxygen at 2 L On IV antibiotic  * UTI Treat with IV ceftriaxone. Check urine cultures. Urine cultures no growth to date She was tachycardic and had mildly elevated WBC on admission.  * Left scapular and rib fractures Pain medications as needed. Oral and IV.  * Atrial fibrillation with rapid ventricular rate - improved Continue verapamil. Add metoprolol.  * Hypertension On verapamil and metoprolol  * Weakness Skilled nursing facility at discharge Likely discharge tomorrow  * DVT prophylaxis with Lovenox  All the records are reviewed and case  discussed with Care Management/Social Workerr. Management plans discussed with the patient,  family and they are in agreement.  CODE STATUS: DNR  DVT Prophylaxis: SCDs  TOTAL TIME TAKING CARE OF THIS PATIENT: 30 minutes.   POSSIBLE D/C TOMORROW DEPENDING ON CLINICAL CONDITION.  Milagros Loll R M.D on 07/20/2015 at 2:28 PM  Between 7am to 6pm - Pager - [phone]  After 6pm go to [email or web address] - password [email or web address]  [facility]  Office  [phone]  CC: Primary care physician; Schuyler Amor, MD  Note: This dictation was prepared with Dragon dictation along with smaller phrase technology. Any transcriptional errors that result from this process are unintentional.

## 2015-07-20 NOTE — Care Management (Addendum)
Patient weaned to room air but then back on O2 as sats dropped. RN now monitoring pulse as it has changed again.  Still with slow progress/PT. Bed [facility] when pain better controlled so that patient can work with PT.

## 2015-07-20 NOTE — Progress Notes (Signed)
Physical Therapy Treatment Patient Details Name: Maureen Arnold MRN: [id number] DOB: 1912/09/28 Today's Date: 07/20/2015    History of Present Illness Patient is a 82 y/o female that presents after falling at home sustaining L scapular fx along with displaced 4,5,6th rib fx on L.     PT Comments    Pt shows good effort despite a lot of pain. She is able to get to sitting but is fearful and pain limited, though she is able to get to standing briefly (with considerable assist.)  Pt has some confusion, but is able to follow simple instructions fairly well.    Follow Up Recommendations  SNF     Equipment Recommendations       Recommendations for Other Services       Precautions / Restrictions Precautions Precautions: Fall Shoulder Interventions: Shoulder sling/immobilizer Precaution Comments: Sling should be on at all times  Restrictions LUE Weight Bearing: Non weight bearing    Mobility  Bed Mobility Overal bed mobility: Needs Assistance Bed Mobility: Supine to Sit     Supine to sit: Mod assist;Max assist     General bed mobility comments: Pt shows good effort, but given her pain has a hard time and needs considerable assist  Transfers Overall transfer level: Needs assistance Equipment used: Rolling walker (2 wheeled) (PT steadying L side of walker)             General transfer comment: Pt is able to stand for ~30 seconds with great effort.  She is able to take one very small side shuffle step but ultimately is unable to maintain standing  Ambulation/Gait                 Stairs            Wheelchair Mobility    Modified Rankin (Stroke Patients Only)       Balance                                    Cognition Arousal/Alertness: Lethargic Behavior During Therapy: Anxious Overall Cognitive Status: History of cognitive impairments - at baseline                      Exercises      General Comments         Pertinent Vitals/Pain Pain Assessment:  (Pt c/o pain with all acts, especially with mobility) Pain Location: mostly L shoulder, generally everywhere    Home Living                      Prior Function            PT Goals (current goals can now be found in the care plan section) Progress towards PT goals: Progressing toward goals    Frequency  7X/week    PT Plan Current plan remains appropriate    Co-evaluation             End of Session Equipment Utilized During Treatment: Oxygen Activity Tolerance: Patient limited by lethargy;Patient limited by pain Patient left: with bed alarm set;with call bell/phone within reach     Time: 0906-0932 PT Time Calculation (min) (ACUTE ONLY): 26 min  Charges:  $Therapeutic Exercise: 8-22 mins $Therapeutic Activity: 8-22 mins                    G Codes:     Maureen Arnold  Stefan ChurchBratton, PT, DPT [phone]  Maureen ProGalen R Lynsee Arnold 07/20/2015, 11:02 AM

## 2015-07-20 NOTE — Progress Notes (Signed)
Palliative Care Update  I briefly reviewed chart and spoke to Dr. Elpidio AnisSudini about pt prior to seeing pt. Dr. Elpidio AnisSudini stated that pt's disposition plan and code status are appropriate at this point and given pts improvement, a palliative care consult is not needed.  Consult is Discontinued.  I will be happy to see pt if things fail to continue to improve.     Suan HalterMargaret F Ridhima Golberg, MD

## 2015-07-21 LAB — CBC
HEMATOCRIT: 41.1 % (ref 35.0–47.0)
Hemoglobin: 13.8 g/dL (ref 12.0–16.0)
MCH: 32 pg (ref 26.0–34.0)
MCHC: 33.5 g/dL (ref 32.0–36.0)
MCV: 95.6 fL (ref 80.0–100.0)
Platelets: 192 10*3/uL (ref 150–440)
RBC: 4.3 MIL/uL (ref 3.80–5.20)
RDW: 13.2 % (ref 11.5–14.5)
WBC: 11.3 10*3/uL — AB (ref 3.6–11.0)

## 2015-07-21 LAB — CREATININE, SERUM: Creatinine, Ser: 0.53 mg/dL (ref 0.44–1.00)

## 2015-07-21 LAB — GLUCOSE, CAPILLARY: GLUCOSE-CAPILLARY: 67 mg/dL (ref 65–99)

## 2015-07-21 NOTE — Progress Notes (Signed)
Patient being [facility]. Report was called to [place]. Iv's removed and patient's belongings packed. EMS called. Daughter called.

## 2015-07-21 NOTE — Progress Notes (Addendum)
Patient will be d/c to [facility]. Staff at [facility] aware of discharge. CSW faxed d/c orders to [phone] per staff request. Staff confirmed to receiving discharge orders and has agreed to weekend admission. Nurse was notified that packet was ready and placed next to patient chart. DNR and hard prescriptions placed in packet. Nurse was given number for report [phone]. Room number is B1.  Patient is alert but disoriented. CSW notified patient daughter Mathis DadJanice Carrier via phone at [phone], that patient was being d/c to [facility]. Daughter has agreed to d/c. Patient will be d/c by EMS, per daughter request.     Sherryl MangesSamantha Reizy Dunlow, BSW, MSW, LCSWA Clinical Social Work Dept [phone]

## 2015-07-21 NOTE — Clinical Social Work Placement (Signed)
   CLINICAL SOCIAL WORK PLACEMENT  NOTE  Date:  07/21/2015  Patient Details  Name: Maureen BenchJanice Mitcham MRN: [id number] Date of Birth: [birth year]  Clinical Social Work is seeking post-discharge placement for this patient at the Skilled  Nursing Facility level of care (*CSW will initial, date and re-position this form in  chart as items are completed):  Yes   Patient/family provided with [facility] Clinical Social Work Department's list of facilities offering this level of care within the geographic area requested by the patient (or if unable, by the patient's family).  Yes   Patient/family informed of their freedom to choose among providers that offer the needed level of care, that participate in Medicare, Medicaid or managed care program needed by the patient, have an available bed and are willing to accept the patient.  Yes   Patient/family informed of [facility]'s ownership interest in [facility] and [facility], as well as of the fact that they are under no obligation to receive care at these facilities.  PASRR submitted to EDS on       PASRR number received on       Existing PASRR number confirmed on 07/18/15     FL2 transmitted to all facilities in geographic area requested by pt/family on 07/18/15     FL2 transmitted to all facilities within larger geographic area on       Patient informed that his/her managed care company has contracts with or will negotiate with certain facilities, including the following:        Yes   Patient/family informed of bed offers received.  Patient chooses bed at  [facility] )     Physician recommends and patient chooses bed at      Patient to be transferred to  [facility]) on 07/21/15.  Patient to be transferred to facility by  (EMS)     Patient family notified on 07/21/15 of transfer.  Name of family member notified:   Rudean Curt(Jancice Carrier (daughter) [phone])     PHYSICIAN       Additional Comment:     _______________________________________________ Starr SinclairSamantha L Cloud Graham, LCSW 07/21/2015, 10:11 AM

## 2015-09-20 DEATH — deceased

## 2016-10-24 IMAGING — CT CT ABD-PELV W/ CM
1 of 3 series · 13 of 32 positions shown, 18 images · IV contrast (omnipaque)
Comparison: None.

CLINICAL DATA: Pt to ED via EMS from home c/o abd pain and vomitng
that started 3 days ago. Per EMS pt c/o RLQ abd pain with tenderness
to touch. Pt is YYMWWxU, speaking in complete and coherent sentences
and in NAD at this time. Hx: htn,

EXAM:
CT ABDOMEN AND PELVIS WITH CONTRAST
TECHNIQUE: Multidetector CT imaging of the abdomen and pelvis was performed
using the standard protocol following bolus administration of
intravenous contrast.
CONTRAST:  100mL OMNIPAQUE IOHEXOL 300 MG/ML  SOLN

[Series 2: routine abd pel with · axial · 0.71mm/px · z∈[-1016,-646]mm · 13 of 84 slices shown, 18 images]
[im 5/84  soft-tissue]
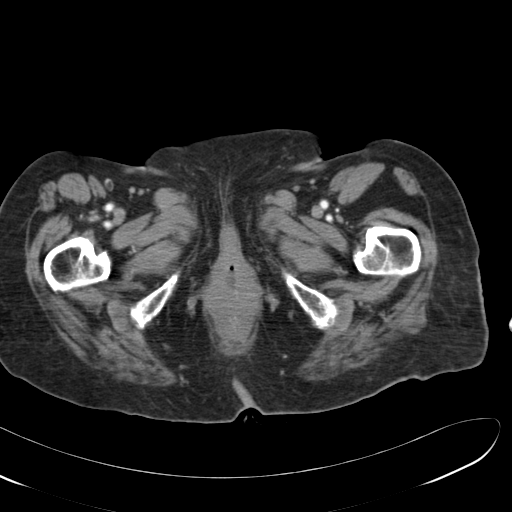
[im 5/84  bone]
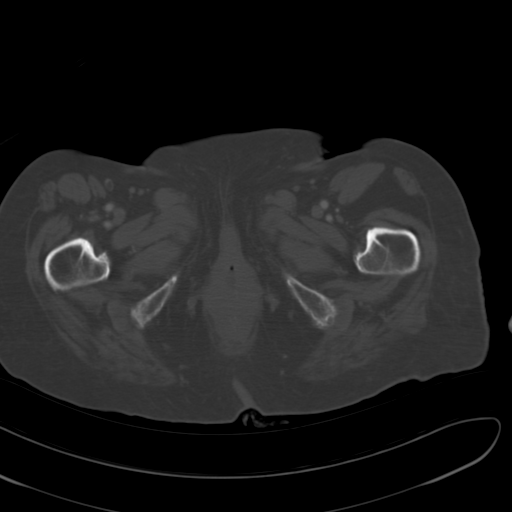
[im 14/84  soft-tissue]
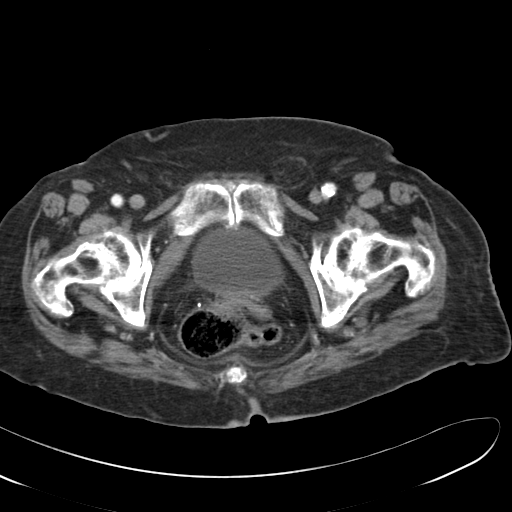
[im 19/84  soft-tissue]
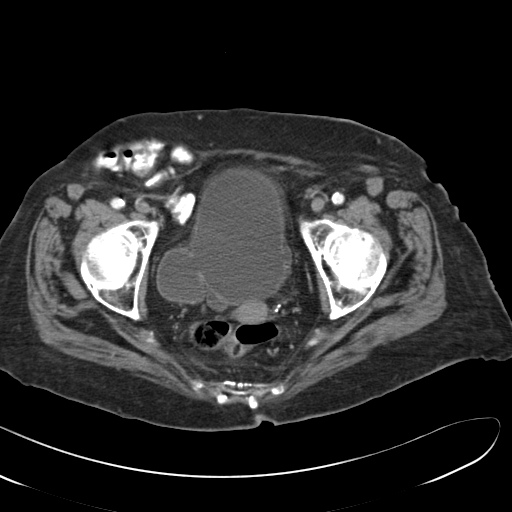
[im 24/84  soft-tissue]
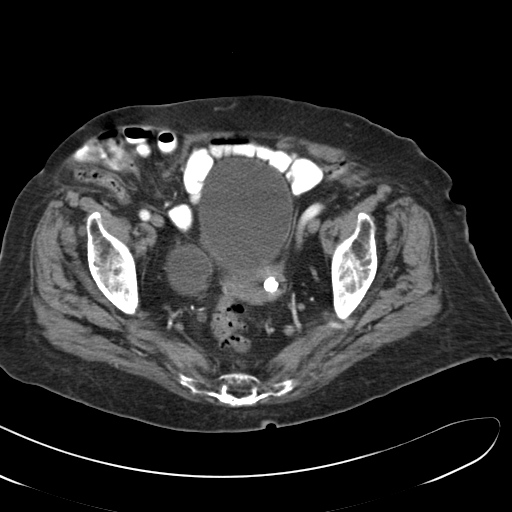
[im 33/84  soft-tissue]
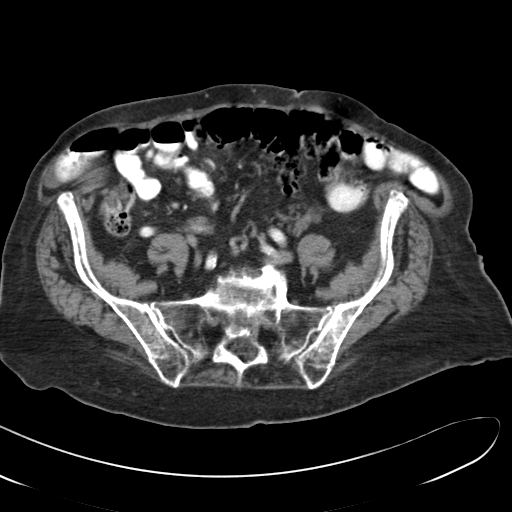
[im 37/84  soft-tissue]
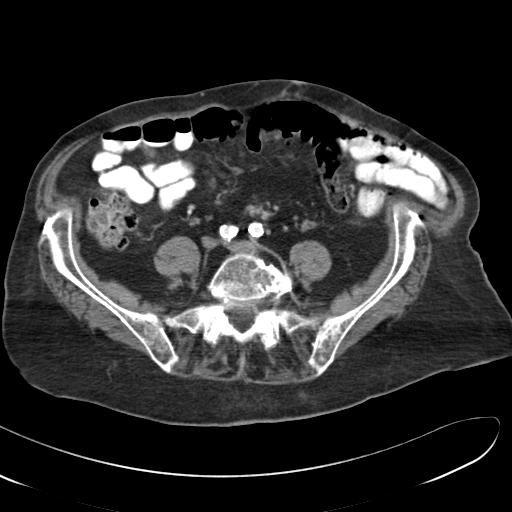
[im 47/84  soft-tissue]
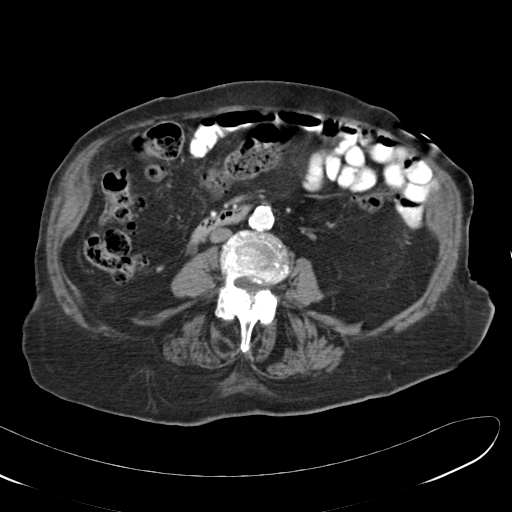
[im 51/84  soft-tissue]
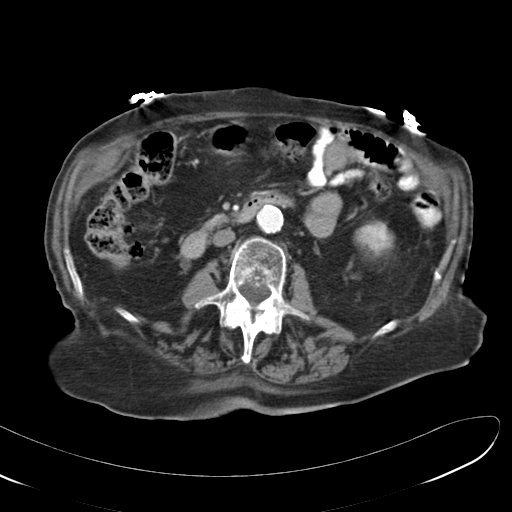
[im 60/84  soft-tissue]
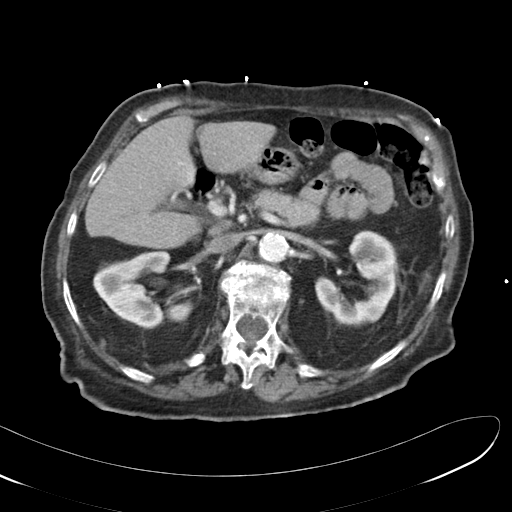
[im 60/84  bone]
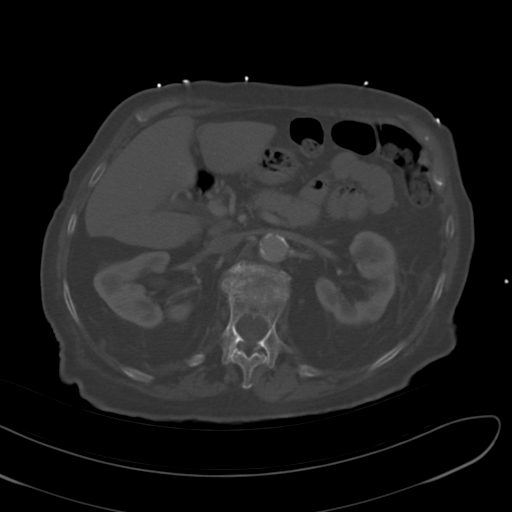
[im 65/84  soft-tissue]
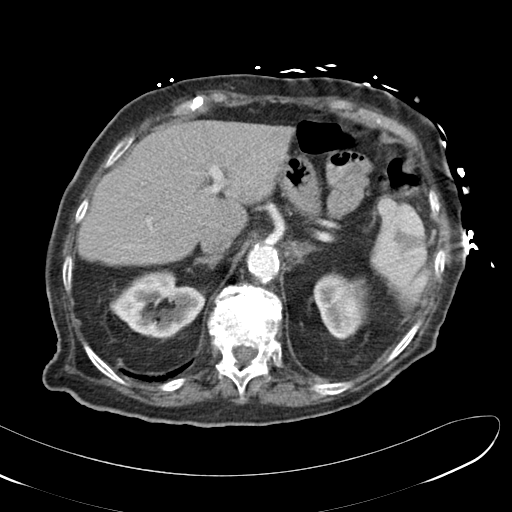
[im 65/84  lung]
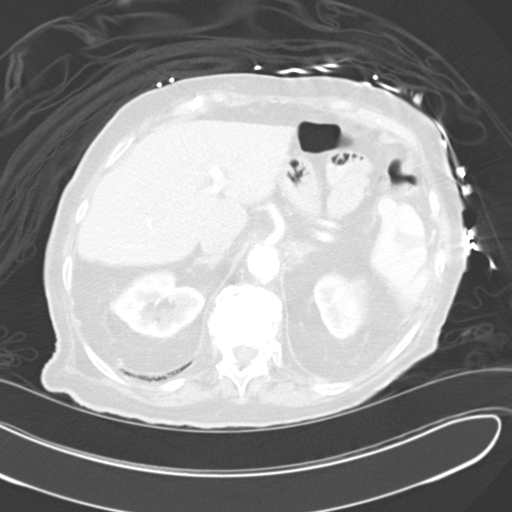
[im 70/84  soft-tissue]
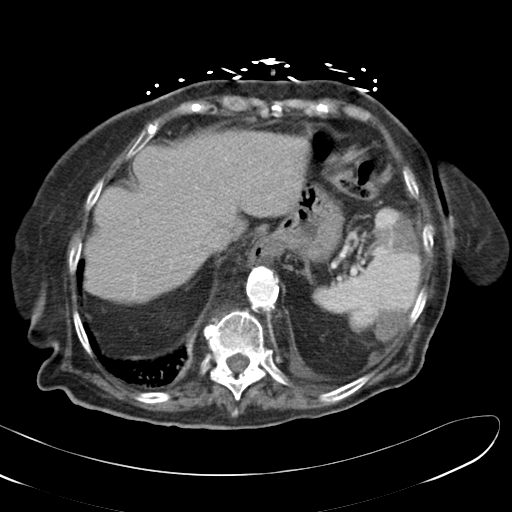
[im 70/84  lung]
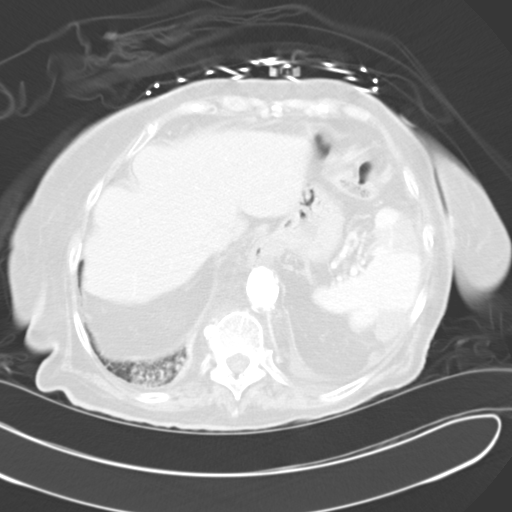
[im 74/84  lung]
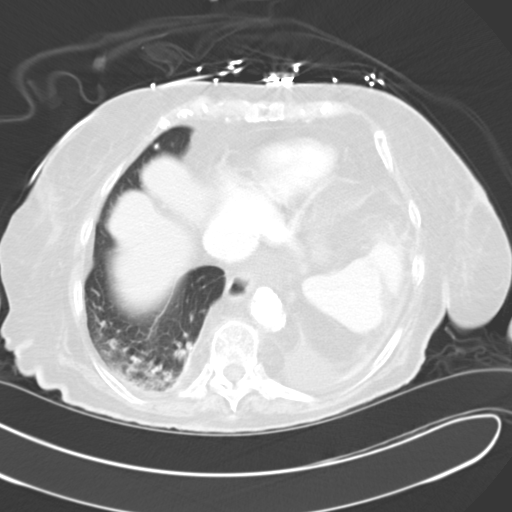
[im 79/84  soft-tissue]
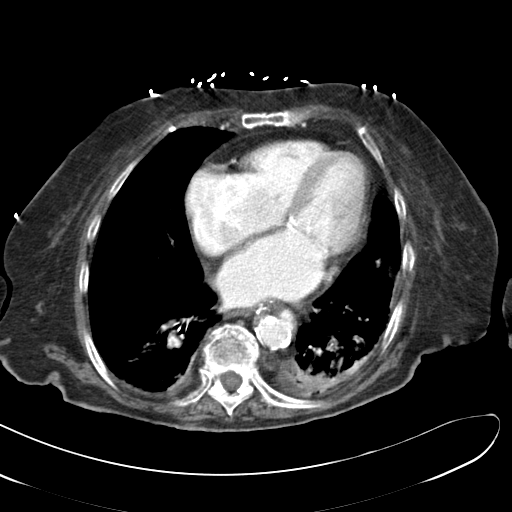
[im 79/84  lung]
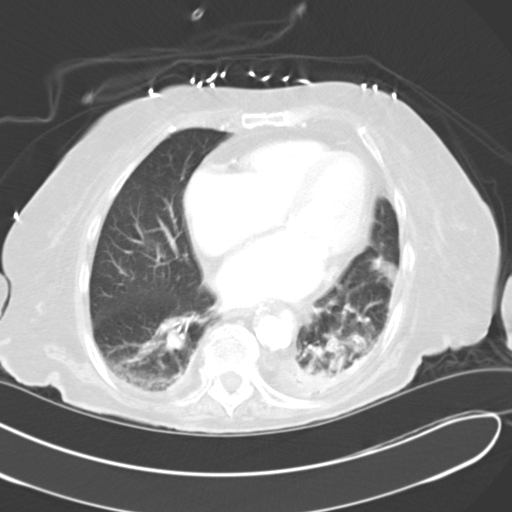

[13 of 32 positions shown; findings below may reference images not displayed]

FINDINGS: Patchy atelectasis/consolidation posteriorly in the lower lobes left
worse than right. There is a tiny left pleural effusion. Biatrial
enlargement.

Small liver. At least 2 large partially calcified stones in the
fundus of the gallbladder without wall thickening. Two Wedge-shaped
low-attenuation regions in the spleen which is not enlarged.

15 mm enhancing left adrenal nodule. Right adrenal unremarkable.
Prominent right extra renal collecting system. Kidneys otherwise
unremarkable. Urinary bladder is distended, with a large right
posterolateral diverticulum. Coarse calcifications presumably
degenerated fibroids in the uterus.

Small hiatal hernia. The stomach is non distended. Small bowel
decompressed. There is a right Spigelian hernia containing several
loops of small bowel and the cecum, without evidence of
strangulation or obstruction. Scattered colonic diverticula most
numerous in the sigmoid segment without significant adjacent
inflammatory/ edematous change.

No ascites. No free air. No adenopathy localized. Extensive
aortoiliac arterial calcifications without aneurysm. Severe
compression fracture deformity of T9, age indeterminate. Moderately
severe compression fracture deformity of T12, apparently post
attempted vertebroplasty. Advanced facet DJD in the lower lumbar
spine.
IMPRESSION: 1. Large right Spigelian hernia containing loops of small bowel and
the cecum, without obstruction or strangulation.
2. Cholelithiasis
3. 15 mm enhancing left adrenal nodule suggesting metastasis or
pheochromocytoma.
4. Colonic diverticulosis most most marked in the sigmoid colon
5. T12 compression fracture deformity post intervention. Severe T9
compression fracture, age indeterminate.
6. Bibasilar airspace opacities with small left effusion.
7. Possible splenic infarcts, age indeterminate.
